# Patient Record
Sex: Female | Born: 1970 | Hispanic: No | Marital: Married | Smoking: Never smoker
Health system: Southern US, Community
[De-identification: ages and names within clinical notes are randomized; demographics above are authoritative.]

## PROBLEM LIST (undated history)

## (undated) DIAGNOSIS — I10 Essential (primary) hypertension: Secondary | ICD-10-CM

## (undated) HISTORY — PX: ABDOMINAL HYSTERECTOMY: SHX81

---

## 2001-07-18 ENCOUNTER — Other Ambulatory Visit: Admission: RE | Admit: 2001-07-18 | Discharge: 2001-07-18 | Payer: Self-pay | Admitting: Obstetrics and Gynecology

## 2015-06-06 ENCOUNTER — Encounter: Payer: Self-pay | Admitting: Vascular Surgery

## 2015-08-02 ENCOUNTER — Emergency Department (HOSPITAL_COMMUNITY): Payer: No Typology Code available for payment source

## 2015-08-02 ENCOUNTER — Emergency Department (HOSPITAL_COMMUNITY)
Admission: EM | Admit: 2015-08-02 | Discharge: 2015-08-02 | Disposition: A | Discharge status: Home / Self Care | Payer: No Typology Code available for payment source | Attending: Emergency Medicine | Admitting: Emergency Medicine

## 2015-08-02 ENCOUNTER — Encounter (HOSPITAL_COMMUNITY): Payer: Self-pay | Admitting: *Deleted

## 2015-08-02 DIAGNOSIS — S4991XA Unspecified injury of right shoulder and upper arm, initial encounter: Secondary | ICD-10-CM | POA: Insufficient documentation

## 2015-08-02 DIAGNOSIS — S199XXA Unspecified injury of neck, initial encounter: Secondary | ICD-10-CM | POA: Diagnosis not present

## 2015-08-02 DIAGNOSIS — Y92410 Unspecified street and highway as the place of occurrence of the external cause: Secondary | ICD-10-CM | POA: Diagnosis not present

## 2015-08-02 DIAGNOSIS — S0990XA Unspecified injury of head, initial encounter: Secondary | ICD-10-CM | POA: Insufficient documentation

## 2015-08-02 DIAGNOSIS — M542 Cervicalgia: Secondary | ICD-10-CM

## 2015-08-02 DIAGNOSIS — Y998 Other external cause status: Secondary | ICD-10-CM | POA: Insufficient documentation

## 2015-08-02 DIAGNOSIS — S4992XA Unspecified injury of left shoulder and upper arm, initial encounter: Secondary | ICD-10-CM | POA: Insufficient documentation

## 2015-08-02 DIAGNOSIS — S3992XA Unspecified injury of lower back, initial encounter: Secondary | ICD-10-CM | POA: Diagnosis not present

## 2015-08-02 DIAGNOSIS — R51 Headache: Secondary | ICD-10-CM

## 2015-08-02 DIAGNOSIS — Y9389 Activity, other specified: Secondary | ICD-10-CM | POA: Diagnosis not present

## 2015-08-02 DIAGNOSIS — R519 Headache, unspecified: Secondary | ICD-10-CM

## 2015-08-02 MED ORDER — IBUPROFEN 800 MG PO TABS: 800.0000 mg | ORAL_TABLET | Freq: Three times a day (TID) | ORAL | Status: DC

## 2015-08-02 MED ORDER — CYCLOBENZAPRINE HCL 10 MG PO TABS: 10.0000 mg | ORAL_TABLET | Freq: Every day | ORAL | Status: DC

## 2015-08-02 MED ORDER — IBUPROFEN 800 MG PO TABS
800.0000 mg | ORAL_TABLET | Freq: Once | ORAL | Status: AC
  Administered 2015-08-02: 800 mg via ORAL
  Filled 2015-08-02: qty 1

## 2015-08-02 MED ORDER — ACETAMINOPHEN 325 MG PO TABS
650.0000 mg | ORAL_TABLET | Freq: Once | ORAL | Status: AC
  Administered 2015-08-02: 650 mg via ORAL
  Filled 2015-08-02: qty 2
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
## 2015-08-02 NOTE — ED Notes (Signed)
Patient transported to X-ray 
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
## 2015-08-02 NOTE — ED Notes (Signed)
C collar removed
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
## 2015-08-02 NOTE — Discharge Instructions (Signed)

## 2015-11-22 ENCOUNTER — Encounter (HOSPITAL_COMMUNITY): Payer: Self-pay | Admitting: *Deleted

## 2015-11-22 ENCOUNTER — Emergency Department (HOSPITAL_COMMUNITY)
Admission: EM | Admit: 2015-11-22 | Discharge: 2015-11-23 | Disposition: A | Discharge status: Home / Self Care | Payer: No Typology Code available for payment source | Attending: Emergency Medicine | Admitting: Emergency Medicine

## 2015-11-22 DIAGNOSIS — Y999 Unspecified external cause status: Secondary | ICD-10-CM | POA: Insufficient documentation

## 2015-11-22 DIAGNOSIS — Y939 Activity, unspecified: Secondary | ICD-10-CM | POA: Insufficient documentation

## 2015-11-22 DIAGNOSIS — Y92410 Unspecified street and highway as the place of occurrence of the external cause: Secondary | ICD-10-CM | POA: Insufficient documentation

## 2015-11-22 DIAGNOSIS — S161XXA Strain of muscle, fascia and tendon at neck level, initial encounter: Secondary | ICD-10-CM | POA: Diagnosis not present

## 2015-11-22 DIAGNOSIS — S199XXA Unspecified injury of neck, initial encounter: Secondary | ICD-10-CM | POA: Diagnosis present
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
## 2015-11-23 ENCOUNTER — Emergency Department (HOSPITAL_COMMUNITY): Payer: No Typology Code available for payment source

## 2015-11-23 MED ORDER — ACETAMINOPHEN 325 MG PO TABS
650.0000 mg | ORAL_TABLET | Freq: Once | ORAL | Status: AC
  Administered 2015-11-23: 650 mg via ORAL
  Filled 2015-11-23: qty 2

## 2015-11-23 NOTE — Discharge Instructions (Signed)
You have neck pain, possibly from a cervical strain and/or pinched nerve.  ° °SEEK IMMEDIATE MEDICAL ATTENTION IF: °You develop difficulties swallowing or breathing.  °You have new or worse numbness, weakness, tingling, or movement problems in your arms or legs.  °You develop increasing pain which is uncontrolled with medications.  °You have change in bowel or bladder function, or other concerns. ° ° ° °
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
## 2018-09-20 ENCOUNTER — Encounter (HOSPITAL_COMMUNITY): Payer: Self-pay

## 2018-09-20 ENCOUNTER — Emergency Department (HOSPITAL_COMMUNITY): Payer: BLUE CROSS/BLUE SHIELD

## 2018-09-20 ENCOUNTER — Emergency Department (HOSPITAL_COMMUNITY)
Admission: EM | Admit: 2018-09-20 | Discharge: 2018-09-20 | Disposition: A | Discharge status: Home / Self Care | Payer: BLUE CROSS/BLUE SHIELD | Attending: Emergency Medicine | Admitting: Emergency Medicine

## 2018-09-20 DIAGNOSIS — R0981 Nasal congestion: Secondary | ICD-10-CM | POA: Insufficient documentation

## 2018-09-20 DIAGNOSIS — Z79899 Other long term (current) drug therapy: Secondary | ICD-10-CM | POA: Insufficient documentation

## 2018-09-20 DIAGNOSIS — I10 Essential (primary) hypertension: Secondary | ICD-10-CM | POA: Diagnosis not present

## 2018-09-20 DIAGNOSIS — R519 Headache, unspecified: Secondary | ICD-10-CM

## 2018-09-20 DIAGNOSIS — R51 Headache: Secondary | ICD-10-CM | POA: Diagnosis not present

## 2018-09-20 MED ORDER — FLUTICASONE PROPIONATE 50 MCG/ACT NA SUSP: 1.0000 | Freq: Every day | NASAL | 2 refills | Status: DC

## 2018-09-20 MED ORDER — ACETAMINOPHEN 500 MG PO TABS
1000.0000 mg | ORAL_TABLET | Freq: Once | ORAL | Status: AC
  Administered 2018-09-20: 1000 mg via ORAL
  Filled 2018-09-20: qty 2

## 2018-09-20 NOTE — ED Notes (Signed)
Patient verbalizes understanding of discharge instructions. Opportunity for questioning and answers were provided. Armband removed by staff, pt discharged from ED. Pt ambulatory to lobby.  

## 2018-09-20 NOTE — ED Notes (Signed)
Patient transported to CT 

## 2018-09-20 NOTE — Discharge Instructions (Addendum)
You can take Tylenol or Ibuprofen as directed for pain. You can alternate Tylenol and Ibuprofen every 4 hours. If you take Tylenol at 1pm, then you can take Ibuprofen at 5pm. Then you can take Tylenol again at 9pm.   Use Flonase as directed for nasal congestion.  As we discussed, your blood pressure here today was elevated.  You need to follow-up with a primary care doctor regarding your blood pressure to make sure that you do not need any medication.  As we discussed today, you do not meet any criteria for COVID-19 thing.  We are recommending anybody with respiratory symptoms to self quarantine themselves for 2 weeks.  Return emergency department for any vision changes, fever, vomiting, difficulty walking, numbness/weakness of your arms or legs, chest pain, difficulty breathing or any other worsening concerning symptoms.

## 2018-09-20 NOTE — ED Provider Notes (Signed)
[facility] EMERGENCY DEPARTMENT Provider Note   CSN: [id number] Arrival date & time: 09/20/18  1907    History   Chief Complaint Chief Complaint  Patient presents with   Headache   Sneezing    HPI Tracey Alvarado is a 48 y.o. female presents for evaluation of 1 week of intermittent headache, sneezing, nasal congestion, rhinorrhea, chills.  Patient reports that for the last week, she has had intermittent headache.  She states the headache will come on gradual in nature and will get progressively worse. She denies any thunderclap headache. She states that she will take Advil and then the headache will get better. She denies any preceding trauma, injury or fall. Patient reports that the headache will get better for a little while and then comes backs. She states headache is currently mild.  She states she has also had some nasal congestion and sneezing. She reports feeling subjective fever and chills but states she has not measured a temperature. She reports an occasional dry cough. No hemoptysis. Patient reports she will intermittently have some lightheadedness. She denies any dizziness, difficulty walking, numbness/weakness, vision changes. She states this morning she had some rhinorrhea, watery discharge from her eyes and sneezing. She took Benadryl which she states improved the symptoms. Patient reports that she saw on the news that this might be concerning for COVID-19 so she came to the ER to get it checked. Patient denies any recent travel. Patient states she has not had any known sick contacts. She denies any known COVID-19 exposure. Patient denies any CP, SOB, abdominal pain, nausea/vomiting. Patient denies any history of headaches.       The history is provided by the patient. The history is limited by a language barrier. No language interpreter was used (Offerred patient language interpreter but she declined. ).    No past medical history on file.  There  are no active problems to display for this patient.   Past Surgical History:  Procedure Laterality Date   ABDOMINAL HYSTERECTOMY     CESAREAN SECTION       OB History   No obstetric history on file.      Home Medications    Prior to Admission medications   Medication Sig Start Date End Date Taking? Authorizing Provider  acetaminophen (TYLENOL) 500 MG tablet Take 1,000 mg by mouth every 6 (six) hours as needed for mild pain or headache.   Yes [provider]  diphenhydrAMINE (BENADRYL) 25 MG tablet Take 25 mg by mouth every 6 (six) hours as needed for itching or allergies.   Yes [provider]  ibuprofen (ADVIL,MOTRIN) 200 MG tablet Take 400 mg by mouth every 6 (six) hours as needed for headache or mild pain.   Yes [provider]  cyclobenzaprine (FLEXERIL) 10 MG tablet Take 1 tablet (10 mg total) by mouth at bedtime. Patient not taking: Reported on 09/20/2018 08/02/15   Cheri Fowler, PA-C  fluticasone [facility]) 50 MCG/ACT nasal spray Place 1 spray into both nostrils daily. 09/20/18   Maxwell Caul, PA-C  fluticasone (FLONASE) 50 MCG/ACT nasal spray Place 1 spray into both nostrils daily. 09/20/18   Maxwell Caul, PA-C  ibuprofen (ADVIL,MOTRIN) 800 MG tablet Take 1 tablet (800 mg total) by mouth 3 (three) times daily. Patient not taking: Reported on 09/20/2018 08/02/15   Cheri Fowler, PA-C    Family History No family history on file.  Social History Social History   Tobacco Use   Smoking status: Never Smoker  Smokeless tobacco: Never Used  Substance Use Topics   Alcohol use: No   Drug use: No     Allergies   Patient has no known allergies.   Review of Systems Review of Systems  Constitutional: Positive for fever (Subjective).  HENT: Positive for congestion.   Eyes: Negative for visual disturbance.  Respiratory: Positive for cough. Negative for shortness of breath.   Cardiovascular: Negative for chest pain.  Gastrointestinal:  Negative for abdominal pain, nausea and vomiting.  Genitourinary: Negative for dysuria and hematuria.  Musculoskeletal: Negative for gait problem.  Neurological: Positive for light-headedness and headaches. Negative for dizziness, weakness and numbness.  All other systems reviewed and are negative.    Physical Exam Updated Vital Signs BP (!) 160/100    Pulse 76    Temp 98.4 F (36.9 C) (Oral)    Resp 16    SpO2 98%   Physical Exam Vitals signs and nursing note reviewed.  Constitutional:      Appearance: Normal appearance. She is well-developed.  HENT:     Head: Normocephalic and atraumatic.     Nose: Congestion present.     Right Sinus: No maxillary sinus tenderness or frontal sinus tenderness.     Left Sinus: No maxillary sinus tenderness or frontal sinus tenderness.  Eyes:     General: Lids are normal.     Conjunctiva/sclera: Conjunctivae normal.     Pupils: Pupils are equal, round, and reactive to light.     Comments: PERRL, EOMs intact.   Neck:     Musculoskeletal: Full passive range of motion without pain and neck supple.     Comments: Neck is supple and without rigidity.  Cardiovascular:     Rate and Rhythm: Normal rate and regular rhythm.     Pulses: Normal pulses.     Heart sounds: Normal heart sounds. No murmur. No friction rub. No gallop.   Pulmonary:     Effort: Pulmonary effort is normal.     Breath sounds: Normal breath sounds.     Comments: Lungs clear to auscultation bilaterally.  Symmetric chest rise.  No wheezing, rales, rhonchi. Abdominal:     Palpations: Abdomen is soft. Abdomen is not rigid.     Tenderness: There is no abdominal tenderness. There is no guarding.  Musculoskeletal: Normal range of motion.  Skin:    General: Skin is warm and dry.     Capillary Refill: Capillary refill takes less than 2 seconds.  Neurological:     Mental Status: She is alert and oriented to person, place, and time.     Comments: Cranial nerves III-XII intact Follows  commands, Moves all extremities  5/5 strength to BUE and BLE  Sensation intact throughout all major nerve distributions Normal coordination No pronator drift. No gait abnormalities  No slurred speech. No facial droop.   Psychiatric:        Speech: Speech normal.      ED Treatments / Results  Labs (all labs ordered are listed, but only abnormal results are displayed) Labs Reviewed - No data to display  EKG None  Radiology Ct Head Wo Contrast  Result Date: 09/20/2018 CLINICAL DATA:  Headache. EXAM: CT HEAD WITHOUT CONTRAST TECHNIQUE: Contiguous axial images were obtained from the base of the skull through the vertex without intravenous contrast. COMPARISON:  None. FINDINGS: Brain: No evidence of acute infarction, hemorrhage, hydrocephalus, extra-axial collection or mass lesion/mass effect. Vascular: No hyperdense vessel or unexpected calcification. Skull: Normal. Negative for fracture or focal lesion. Sinuses/Orbits:  No acute finding. Other: None. IMPRESSION: Normal head CT. Electronically Signed   By: Lupita Raider, M.D.   On: 09/20/2018 20:20    Procedures Procedures (including critical care time)  Medications Ordered in ED Medications  acetaminophen (TYLENOL) tablet 1,000 mg (1,000 mg Oral Given 09/20/18 2018)     Initial Impression / Assessment and Plan / ED Course  I have reviewed the triage vital signs and the nursing notes.  Pertinent labs & imaging results that were available during my care of the patient were reviewed by me and considered in my medical decision making (see chart for details).        48 y.o. F who presents for evaluation of 1 week of intermittent headache, congestion, subjective fevers/chills, rhinorrhea. Headache is gradual in nature. No vision changes, nausea/vomiting, difficulty walking. No trauma or injury. Also reports some sneezing, cough. No CP, SOB. She reports she saw on the news that this might be concerning for COVID-19 so she came to the  ER. Patient is afebrile, non-toxic appearing, sitting comfortably on examination table.  Vital signs reviewed. She is hypertensive. No CP, SOB, numbness/weakness. She states she has not had a history of HTN previously. No neuro deficits noted on exam. Patient states she is worried her symptoms might be due to COVID-19. Patient with no recent travel, no sick contacts. Lungs clear to auscultation. No evidence of respiratory distress. Consider seasonal allergies vs viral URI vs sinus congestion. History/physical exam is not concerning for meningitis, CVA/posterior stroke. Do not suspect ICH or hypertensive emergency. Patient states she does not have a history of headaches. Will plan for CT head and PO analgesics.   Head CT negative for any acute intracranial abnormality.   Repeat vitals show improved blood pressure. She is still slightly hypertensive. Discussed results with patient. She reports improvement in headache after analgesics. Patient declined any further medication here in the ED. Patient states she would like to go home. Patient able to ambulate here in the ED without any difficulty. NO gait ataxia. I discussed with patient that at this time she does not meet criteria for COVID-19 testing. Instructed patient to self quarantine herself give URI symptoms. Instructed pateint to follow up with [facility] for blood pressure. At this time, patient exhibits no emergent life-threatening condition that require further evaluation in ED. Patient had ample opportunity for questions and discussion. All patient's questions were answered with full understanding. Strict return precautions discussed. Patient expresses understanding and agreement to plan.   Davie Tith was evaluated in Emergency Department on 09/21/2018 for the symptoms described in the history of present illness. She was evaluated in the context of the global COVID-19 pandemic, which necessitated consideration that the patient might  be at risk for infection with the SARS-CoV-2 virus that causes COVID-19. Institutional protocols and algorithms that pertain to the evaluation of patients at risk for COVID-19 are in a state of rapid change based on information released by regulatory bodies including the CDC and federal and state organizations. These policies and algorithms were followed during the patient's care in the ED.   Portions of this note were generated with Scientist, clinical (histocompatibility and immunogenetics). Dictation errors may occur despite best attempts at proofreading.   Final Clinical Impressions(s) / ED Diagnoses   Final diagnoses:  Nasal congestion  Acute nonintractable headache, unspecified headache type  Hypertension, unspecified type    ED Discharge Orders         Ordered    fluticasone (FLONASE) 50 MCG/ACT nasal  spray  Daily     09/20/18 2129    fluticasone (FLONASE) 50 MCG/ACT nasal spray  Daily     09/20/18 2143           Rosana Hoes 09/21/18 6010    Little, Ambrose Finland, MD 09/21/18 1336

## 2018-09-20 NOTE — ED Triage Notes (Addendum)
Pt arrives POV for sneezing and intermittent headache x 1 week. Pt denies chest pain or sob. Pt alert, oriented, and ambulatory. Pt endorses took benadryl this am which helped.

## 2020-01-12 ENCOUNTER — Encounter (HOSPITAL_COMMUNITY): Payer: Self-pay | Admitting: Emergency Medicine

## 2020-01-12 ENCOUNTER — Emergency Department (HOSPITAL_COMMUNITY)
Admission: EM | Admit: 2020-01-12 | Discharge: 2020-01-13 | Disposition: A | Discharge status: Home / Self Care | Payer: BLUE CROSS/BLUE SHIELD | Attending: Emergency Medicine | Admitting: Emergency Medicine

## 2020-01-12 ENCOUNTER — Other Ambulatory Visit: Payer: Self-pay

## 2020-01-12 DIAGNOSIS — T7840XA Allergy, unspecified, initial encounter: Secondary | ICD-10-CM

## 2020-01-12 DIAGNOSIS — R000 Tachycardia, unspecified: Secondary | ICD-10-CM | POA: Insufficient documentation

## 2020-01-12 DIAGNOSIS — Z20822 Contact with and (suspected) exposure to covid-19: Secondary | ICD-10-CM | POA: Insufficient documentation

## 2020-01-12 DIAGNOSIS — R21 Rash and other nonspecific skin eruption: Secondary | ICD-10-CM | POA: Diagnosis present

## 2020-01-12 DIAGNOSIS — L500 Allergic urticaria: Secondary | ICD-10-CM | POA: Insufficient documentation

## 2020-01-12 LAB — BASIC METABOLIC PANEL
Anion gap: 7 (ref 5–15)
BUN: 21 mg/dL — ABNORMAL HIGH (ref 6–20)
CO2: 25 mmol/L (ref 22–32)
Calcium: 9.6 mg/dL (ref 8.9–10.3)
Chloride: 108 mmol/L (ref 98–111)
Creatinine, Ser: 0.81 mg/dL (ref 0.44–1.00)
GFR calc Af Amer: >60 mL/min (ref >60)
GFR calc non Af Amer: >60 mL/min (ref >60)
Glucose, Bld: 130 mg/dL — ABNORMAL HIGH (ref 70–99)
Potassium: 3.9 mmol/L (ref 3.5–5.1)
Sodium: 140 mmol/L (ref 135–145)

## 2020-01-12 LAB — CBC
HCT: 43.5 % (ref 36.0–46.0)
Hemoglobin: 14.5 g/dL (ref 12.0–15.0)
MCH: 31 pg (ref 26.0–34.0)
MCHC: 33.3 g/dL (ref 30.0–36.0)
MCV: 93.1 fL (ref 80.0–100.0)
Platelets: 207 10*3/uL (ref 150–400)
RBC: 4.67 MIL/uL (ref 3.87–5.11)
RDW: 12.8 % (ref 11.5–15.5)
WBC: 11.4 10*3/uL — ABNORMAL HIGH (ref 4.0–10.5)
nRBC: 0 % (ref 0.0–0.2)

## 2020-01-12 MED ORDER — ACETAMINOPHEN 325 MG PO TABS
650.0000 mg | ORAL_TABLET | Freq: Once | ORAL | Status: AC | PRN
  Administered 2020-01-12: 650 mg via ORAL
  Filled 2020-01-12: qty 2

## 2020-01-12 NOTE — ED Triage Notes (Signed)
Pt presents to ED POV. Pt c/o allergic reaction and n/d. Seen at [facility] given papcid and prednisone. Since then pt has had n/d, febrile today.

## 2020-01-13 ENCOUNTER — Emergency Department (HOSPITAL_COMMUNITY): Payer: BLUE CROSS/BLUE SHIELD

## 2020-01-13 LAB — HEPATIC FUNCTION PANEL
ALT: 29 U/L (ref 0–44)
AST: 23 U/L (ref 15–41)
Albumin: 4.1 g/dL (ref 3.5–5.0)
Alkaline Phosphatase: 58 U/L (ref 38–126)
Bilirubin, Direct: 0.1 mg/dL (ref 0.0–0.2)
Indirect Bilirubin: 0.8 mg/dL (ref 0.3–0.9)
Total Bilirubin: 0.9 mg/dL (ref 0.3–1.2)
Total Protein: 6.9 g/dL (ref 6.5–8.1)

## 2020-01-13 LAB — URINALYSIS, ROUTINE W REFLEX MICROSCOPIC
Bilirubin Urine: NEGATIVE
Glucose, UA: NEGATIVE mg/dL
Hgb urine dipstick: NEGATIVE
Ketones, ur: NEGATIVE mg/dL
Nitrite: NEGATIVE
Protein, ur: 30 mg/dL — AB
Specific Gravity, Urine: 1.033 — ABNORMAL HIGH (ref 1.005–1.030)
pH: 5 (ref 5.0–8.0)

## 2020-01-13 LAB — SARS CORONAVIRUS 2 BY RT PCR (HOSPITAL ORDER, PERFORMED IN ~~LOC~~ HOSPITAL LAB): SARS Coronavirus 2: NEGATIVE

## 2020-01-13 MED ORDER — EPINEPHRINE 0.3 MG/0.3ML IJ SOSY
PREFILLED_SYRINGE | INTRAMUSCULAR | 0 refills | Status: AC
Start: 2020-01-13 — End: ?

## 2020-01-13 MED ORDER — CEPHALEXIN 500 MG PO CAPS: 500.0000 mg | ORAL_CAPSULE | Freq: Two times a day (BID) | ORAL | 0 refills | Status: AC

## 2020-01-13 MED ORDER — DIPHENHYDRAMINE HCL 50 MG/ML IJ SOLN
50.0000 mg | Freq: Once | INTRAMUSCULAR | Status: AC
  Administered 2020-01-13: 50 mg via INTRAVENOUS
  Filled 2020-01-13: qty 1

## 2020-01-13 MED ORDER — CEPHALEXIN 250 MG PO CAPS
500.0000 mg | ORAL_CAPSULE | Freq: Once | ORAL | Status: AC
  Administered 2020-01-13: 500 mg via ORAL
  Filled 2020-01-13: qty 2

## 2020-01-13 NOTE — ED Provider Notes (Signed)
[facility] EMERGENCY DEPARTMENT Provider Note   CSN: [id number] Arrival date & time: 01/12/20  2024     History Chief Complaint  Patient presents with  . Allergic Reaction    Tracey Alvarado is a 49 y.o. female.  who presents to the ED with diffuse red rash that itches for the past two days. 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 She states she was weed eating and using a leaf blower, denies picking weeds with her bare hands. Denies walking in wooded area. She is unsure if she was around any poison ivy/oak. Denies any tick bites. She states she is often bit by mosquitos while at work, but that she wears long pants and a long sleeve shirt as well.  When she got home after working two days ago, she states the itching was worse and she noticed a red raised rash. She states she took benadryl and the itching or rash did not improve, she took another dose of benadryl and the rash/itching improved, but then returned and was worse. She then noticed the rash was on her legs, torso, back, and face. She went to urgent care yesterday and was prescribed famotidine and prednisone. She denies taking any doses of prednisone. This morning she felt as though something was in her throat when she swallowed, she mentions that she felt as though she felt short breath. She also notes a change in her voice. Her lips and tongue were swollen but she states this has improved.   She notes associated symptoms of 2 episodes of non-bloody diarrhea yesterday and after her bowel movements she felt fatigued, warm, and had sweats. She denies chest pain, back pain, abdominal pain, nausea, vomiting, or leg pain. She denies any urinary symptoms of pain when she urinates or difficulty urinating. She denies recent illness. She denies any rashes prior to this one.   She denies change in diet or soap. She denies starting any new medications recently.  She denies any other recent lifestyle changes. When asked about her allergy history, she states that grass causes her to itch, but it resolves on its own and has never been systemic. She denies anything like this in the past.   Patient notes she is COVID vaccinated.   The history is provided by the patient.  Rash Location:  Full body Quality: itchiness, redness and swelling   Severity:  Moderate Onset quality:  Gradual Timing:  Constant Progression:  Spreading Context: not new detergent/soap and not sick contacts   Relieved by:  Antihistamines Associated symptoms: diarrhea, fatigue, fever, hoarse voice, shortness of breath and tongue swelling   Associated symptoms: no abdominal pain, no myalgias, no nausea, not vomiting and not wheezing   Diarrhea:    Severity:  Mild     History reviewed. No pertinent past medical history.  There are no problems to display for this patient.   Past Surgical History:  Procedure Laterality Date  . ABDOMINAL HYSTERECTOMY    . CESAREAN SECTION       OB History   No obstetric history on file.     History reviewed. No pertinent family history.  Social History   Tobacco Use  . Smoking status: Never Smoker  . Smokeless tobacco: Never Used  Substance Use Topics  . Alcohol use: No  . Drug use: No    Home Medications Prior to Admission medications   Medication Sig Start Date End Date Taking?  Authorizing Provider  acetaminophen (TYLENOL) 500 MG tablet Take 1,000 mg by mouth every 6 (six) hours as needed for mild pain, fever or headache.    Yes [provider]  diphenhydrAMINE (BENADRYL) 25 MG tablet Take 25 mg by mouth every 6 (six) hours as needed for itching or allergies.   Yes [provider]  famotidine (PEPCID) 20 MG tablet Take 20 mg by mouth 2 (two) times daily.   Yes [provider]  ibuprofen (ADVIL,MOTRIN) 200 MG tablet Take 400 mg by mouth every 6 (six) hours as needed for fever, headache or mild pain.     Yes [provider]  cephALEXin (KEFLEX) 500 MG capsule Take 1 capsule (500 mg total) by mouth 2 (two) times daily for 7 days. 01/14/20 01/21/20  Belva Agee, MD  cyclobenzaprine (FLEXERIL) 10 MG tablet Take 1 tablet (10 mg total) by mouth at bedtime. Patient not taking: Reported on 09/20/2018 08/02/15   Cheri Fowler, PA-C  EPINEPHrine 0.3 MG/0.3ML SOSY Inject one auto-injector into thigh one time if symptoms of anaphylaxis and seek medical attention. 01/13/20   Samayra Hebel, MD  fluticasone (FLONASE) 50 MCG/ACT nasal spray Place 1 spray into both nostrils daily. Patient not taking: Reported on 01/13/2020 09/20/18   Graciella Freer A, PA-C  fluticasone (FLONASE) 50 MCG/ACT nasal spray Place 1 spray into both nostrils daily. Patient not taking: Reported on 01/13/2020 09/20/18   Maxwell Caul, PA-C  ibuprofen (ADVIL,MOTRIN) 800 MG tablet Take 1 tablet (800 mg total) by mouth 3 (three) times daily. Patient not taking: Reported on 09/20/2018 08/02/15   Cheri Fowler, PA-C  predniSONE (STERAPRED UNI-PAK 48 TAB) 10 MG (48) TBPK tablet Take 1-2 tablets by mouth See admin instructions. Take 2 tablets before breakfast, take 1 tablet after lunch, take 1 tablet after supper, take 2 tablets at bedtime for the first 4 days. On days 5-8, take 1 tablet before breakfast, 1 tablet after lunch. 1 tablet after supper, and 1 tablet at bedtime. On days 9-12 take 1 tablet before breakfast and at bedtime. 01/12/20   [provider]    Allergies    Patient has no known allergies.  Review of Systems   Review of Systems  Constitutional: Positive for fatigue and fever.  HENT: Positive for hoarse voice.        Felt like something was in her throat when swallowing and change in voice. Lip and tongue swelling  Respiratory: Positive for shortness of breath. Negative for wheezing.   Cardiovascular: Negative for chest pain and leg swelling.  Gastrointestinal: Positive for diarrhea. Negative for  abdominal pain, constipation, nausea and vomiting.  Genitourinary: Negative for difficulty urinating and dysuria.  Musculoskeletal: Negative for myalgias.  Skin: Positive for rash (diffuse itchy rash).  All other systems reviewed and are negative.   Physical Exam Updated Vital Signs BP 127/67   Pulse 84   Temp (!) 100.6 F (38.1 C) (Oral)   Resp 17   Ht 5\' 1"  (1.549 m)   Wt 63.5 kg   SpO2 97%   BMI 26.45 kg/m   Physical Exam Vitals and nursing note reviewed.  Constitutional:      Comments: Diffuse rash present  HENT:     Mouth/Throat:     Mouth: Mucous membranes are moist.     Pharynx: Oropharynx is clear.     Comments: Lips appear slightly swollen. No pharyngeal or tongue swelling present  Eyes:     Pupils: Pupils are equal, round, and reactive to light.  Cardiovascular:     Rate and Rhythm: Regular rhythm. Tachycardia present.     Heart sounds: Normal heart sounds.  Pulmonary:     Effort: Pulmonary effort is normal. No respiratory distress.     Breath sounds: Normal breath sounds. No stridor. No wheezing, rhonchi or rales.  Abdominal:     General: Abdomen is flat. There is no distension.     Palpations: Abdomen is soft.     Tenderness: There is no abdominal tenderness. There is no guarding or rebound.  Musculoskeletal:        General: No swelling.     Cervical back: Normal range of motion and neck supple. No tenderness.     Right lower leg: No edema.     Left lower leg: No edema.  Skin:    Findings: Rash present. Rash is urticarial (Rash present on face, neck, extremities, torso). Rash is not papular, pustular or vesicular.  Neurological:     General: No focal deficit present.     Mental Status: She is alert and oriented to person, place, and time.  Psychiatric:        Mood and Affect: Mood normal.        Behavior: Behavior normal.     ED Results / Procedures / Treatments   Labs (all labs ordered are listed, but only abnormal results are displayed) Labs  Reviewed  CBC - Abnormal; Notable for the following components:      Result Value   WBC 11.4 (*)    All other components within normal limits  BASIC METABOLIC PANEL - Abnormal; Notable for the following components:   Glucose, Bld 130 (*)    BUN 21 (*)    All other components within normal limits  URINALYSIS, ROUTINE W REFLEX MICROSCOPIC - Abnormal; Notable for the following components:   APPearance CLOUDY (*)    Specific Gravity, Urine 1.033 (*)    Protein, ur 30 (*)    Leukocytes,Ua LARGE (*)    Bacteria, UA RARE (*)    All other components within normal limits  SARS CORONAVIRUS 2 BY RT PCR (HOSPITAL ORDER, PERFORMED IN [place] [facility])  URINE CULTURE  HEPATIC FUNCTION PANEL    EKG None  Radiology DG Chest 1 View  Result Date: 01/13/2020 CLINICAL DATA:  Allergic reaction.  Now with nausea and diarrhea. EXAM: CHEST  1 VIEW COMPARISON:  None. FINDINGS: The heart size and mediastinal contours are within normal limits. Both lungs are clear. The visualized skeletal structures are unremarkable. IMPRESSION: No active disease. Electronically Signed   By: Signa Kellaylor  Stroud M.D.   On: 01/13/2020 10:06    Procedures Procedures (including critical care time)  Medications Ordered in ED Medications  cephALEXin (KEFLEX) capsule 500 mg (has no administration in time range)  acetaminophen (TYLENOL) tablet 650 mg (650 mg Oral Given 01/12/20 2120)  diphenhydrAMINE (BENADRYL) injection 50 mg (50 mg Intravenous Given 01/13/20 1050)    ED Course  I have reviewed the triage vital signs and the nursing notes.  Pertinent labs & imaging results that were available during my care of the patient were reviewed by me and considered in my medical decision making (see chart for details).  3:17 PM Urticaria has resolved on her arms, legs, and face. There are some residual areas of urticaria on her chest, but they appear less severe. Patient notes itching has also resolved. She is resting  comfortably in the bed.     MDM Rules/Calculators/A&P  Gaylyn Berish is a 49 y/o F who presents to the ED with diffuse hives for the past three days that are itchy in nature. 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 As the day progressed she noticed diffuse raised rash that was itchy, that improved with benadryl but returned and worsened. She had not taken any additional benadryl since two days ago. She went to urgent care where she was prescribed famotidine and prednisone, however, with no improvement, she came to the ED. On physical exam patient had diffuse urticaria with some lip swelling.   Laboratory data did not reveal significant leukocytosis, electrolyte or hepatic function panel abnormalities. Her urinalysis was suspicious for urinary tract infection with large amounts of leukocytes, and rare bacteria. Low suspicion for contamination as her squamous epithelia were in normal range. Urine culture pending at this time. Her creatinine was .81 and BUN of 21, do not suspect pyelonephritis. Her COVID test was also negative.   Patient's urticaria improved after IV benadryl and patient states she feels better overall.   Patient's temperature of 100.6 while in the ED as well as tachycardia, would lead me to think possible infectious cause. Suspect patient's UTI as cause of increased temperature while in the ED.   Low suspicion for infection, vasculitis and malignancy as cause of rash. Suspected origin may have occurred while patient was at work, insect bite, poison ivy/oak, or other allergic reaction.  Instructed patient to continue benadryl 25 mg every 6 hours PRN for worsening rash/itching and continue prednisone that was written by urgent care. Also given one does of keflex 500 mg PO while in the ED. Will write prescription for keflex and patient will be notified if cultures result in patient's infection being resistant to keflex.  Discussed with patient risk of not continuing to take antibiotics, including worsening infection, kidney infection, or sepsis. Also wrote patient for epipen.   Patient also instructed to return to the ED if symptoms worsen or new symptoms arise such as difficulty breathing, swallowing, back pain, abdominal pain. Also instructed her to call her primary care office tomorrow to follow up. She agrees with the plan.    Final Clinical Impression(s) / ED Diagnoses Final diagnoses:  Allergic reaction, initial encounter    Rx / DC Orders ED Discharge Orders         Ordered    cephALEXin (KEFLEX) 500 MG capsule  2 times daily     Discontinue  Reprint     01/13/20 1509    EPINEPHrine 0.3 MG/0.3ML SOSY     Discontinue  Reprint     01/13/20 1509           Belva Agee, MD 01/13/20 1517    Little, Ambrose Finland, MD 01/17/20 [phone]

## 2020-01-13 NOTE — ED Notes (Signed)
Pt complaining of more hives and itching, RN aware.

## 2020-01-13 NOTE — ED Notes (Signed)
Urine sent to lab WITH culture. 

## 2020-01-13 NOTE — ED Notes (Signed)
AVS reviewed with pt. PT ambulatory out of dept.   

## 2020-01-13 NOTE — Discharge Instructions (Addendum)
Please call your primary care provider tomorrow to follow up for your visit today.   Please take benadryl 25 mg every 6 hours for continued rash, itching and take medications prescribed by the urgent care, prednisone and famotidine. Please also take the prescribed antibiotic, keflex, for your urinary tract infection.   Please return to the emergency department or call 911 if you have any trouble breathing, worsening of your rash/hives, back pain, chest pain, abdominal pain, or trouble swallowing or for any other reason you feel as though you are having a medical emergency.   Thank you for allowing [place] to assist in your care!

## 2020-01-13 NOTE — ED Notes (Signed)
Pt is aware we need a urine sample. 

## 2020-01-14 LAB — URINE CULTURE

## 2020-03-12 ENCOUNTER — Other Ambulatory Visit: Payer: Self-pay | Admitting: Family Medicine

## 2020-03-12 DIAGNOSIS — Z1231 Encounter for screening mammogram for malignant neoplasm of breast: Secondary | ICD-10-CM

## 2020-03-14 DIAGNOSIS — E7800 Pure hypercholesterolemia, unspecified: Secondary | ICD-10-CM | POA: Insufficient documentation

## 2020-03-14 DIAGNOSIS — R7303 Prediabetes: Secondary | ICD-10-CM | POA: Insufficient documentation

## 2021-06-26 ENCOUNTER — Other Ambulatory Visit: Payer: Self-pay

## 2021-06-26 ENCOUNTER — Emergency Department (HOSPITAL_COMMUNITY)
Admission: EM | Admit: 2021-06-26 | Discharge: 2021-06-27 | Disposition: A | Discharge status: Home / Self Care | Payer: No Typology Code available for payment source | Attending: Student | Admitting: Student

## 2021-06-26 ENCOUNTER — Emergency Department (HOSPITAL_COMMUNITY): Payer: No Typology Code available for payment source

## 2021-06-26 DIAGNOSIS — M79604 Pain in right leg: Secondary | ICD-10-CM | POA: Diagnosis not present

## 2021-06-26 DIAGNOSIS — M25561 Pain in right knee: Secondary | ICD-10-CM | POA: Diagnosis not present

## 2021-06-26 DIAGNOSIS — M79601 Pain in right arm: Secondary | ICD-10-CM | POA: Insufficient documentation

## 2021-06-26 DIAGNOSIS — M79641 Pain in right hand: Secondary | ICD-10-CM | POA: Diagnosis not present

## 2021-06-26 DIAGNOSIS — R42 Dizziness and giddiness: Secondary | ICD-10-CM | POA: Diagnosis not present

## 2021-06-26 DIAGNOSIS — M542 Cervicalgia: Secondary | ICD-10-CM | POA: Diagnosis not present

## 2021-06-26 DIAGNOSIS — Y92410 Unspecified street and highway as the place of occurrence of the external cause: Secondary | ICD-10-CM | POA: Diagnosis not present

## 2021-06-26 DIAGNOSIS — Z5321 Procedure and treatment not carried out due to patient leaving prior to being seen by health care provider: Secondary | ICD-10-CM | POA: Insufficient documentation
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
## 2021-06-26 NOTE — ED Notes (Signed)
Pt states she will call medical records to see her results but is leaving d/t wait time

## 2022-03-16 ENCOUNTER — Other Ambulatory Visit: Payer: Self-pay | Admitting: Family Medicine

## 2022-03-16 DIAGNOSIS — Z1231 Encounter for screening mammogram for malignant neoplasm of breast: Secondary | ICD-10-CM

## 2022-03-22 ENCOUNTER — Ambulatory Visit: Payer: Self-pay

## 2022-03-23 ENCOUNTER — Ambulatory Visit
Admission: RE | Admit: 2022-03-23 | Discharge: 2022-03-23 | Disposition: A | Discharge status: Home / Self Care | Payer: Commercial Managed Care - HMO | Source: Ambulatory Visit | Attending: Family Medicine | Admitting: Family Medicine

## 2022-03-23 DIAGNOSIS — Z1231 Encounter for screening mammogram for malignant neoplasm of breast: Secondary | ICD-10-CM

## 2022-05-31 IMAGING — CT CT HEAD W/O CM
4 series · 16 of 47 positions shown, 18 images · non-contrast
Comparison: 09/20/2018

CLINICAL DATA: Dizziness

EXAM:
CT HEAD WITHOUT CONTRAST
TECHNIQUE: Contiguous axial images were obtained from the base of the skull
through the vertex without intravenous contrast.

[Series 3: head wo · axial · 0.36mm/px · z∈[-124,-4]mm · 7 of 34 slices shown, 9 images]
[im 5/34  brain]
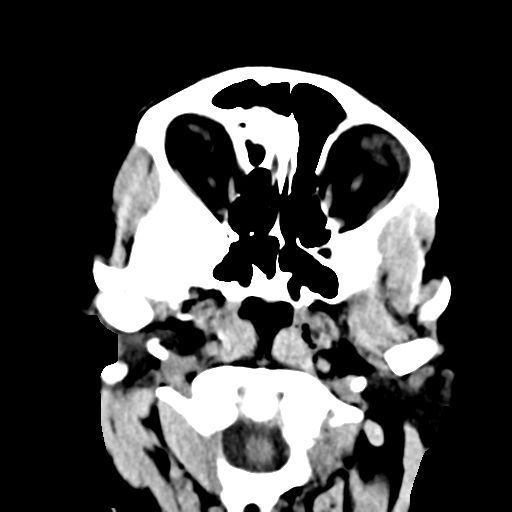
[im 5/34  bone]
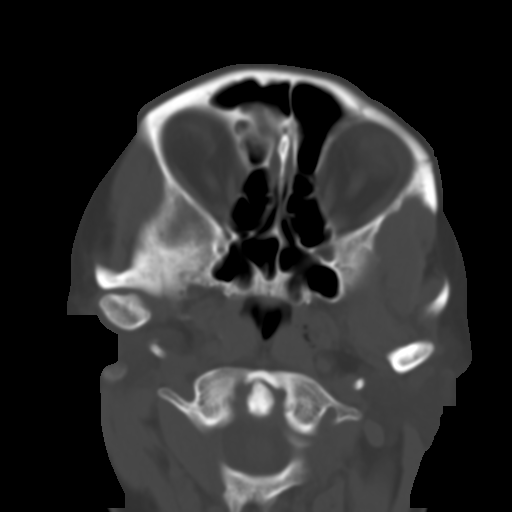
[im 9/34  brain]
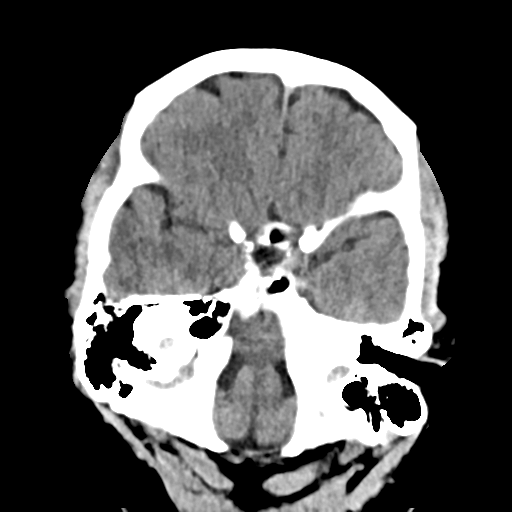
[im 13/34  brain]
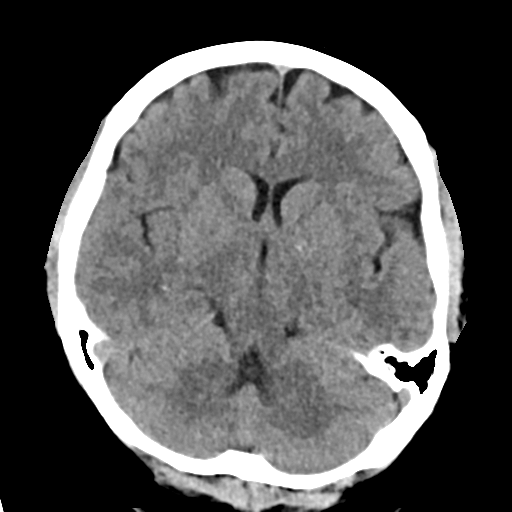
[im 17/34  brain]
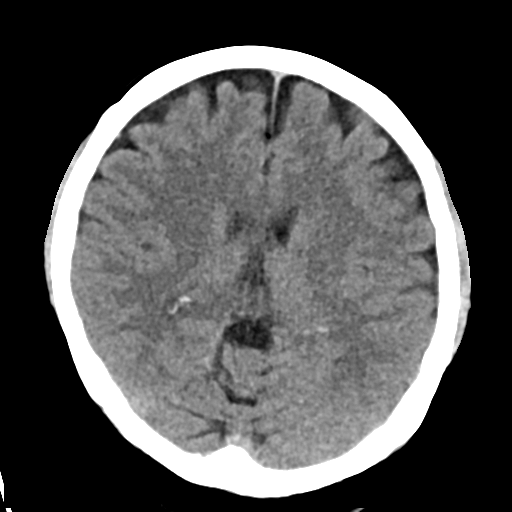
[im 21/34  brain]
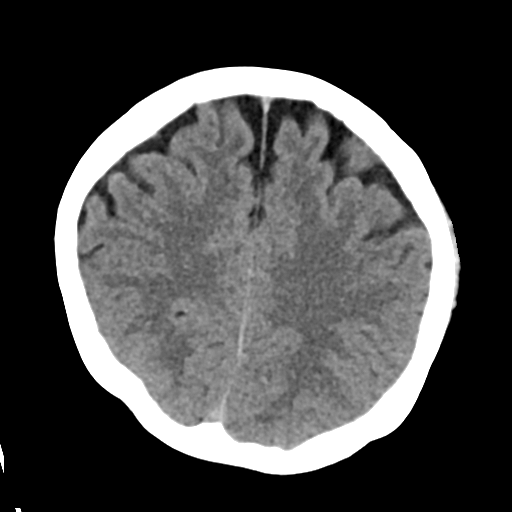
[im 21/34  bone]
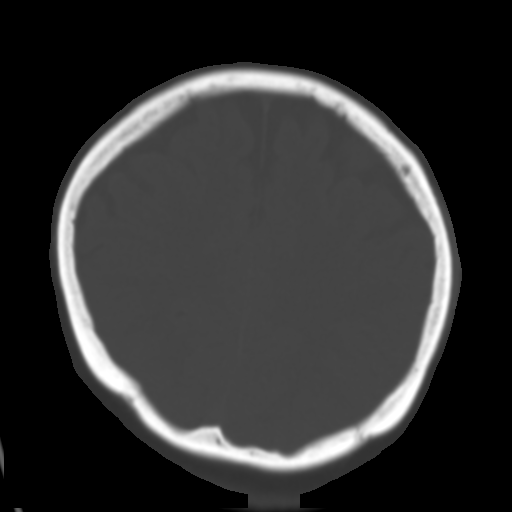
[im 25/34  brain]
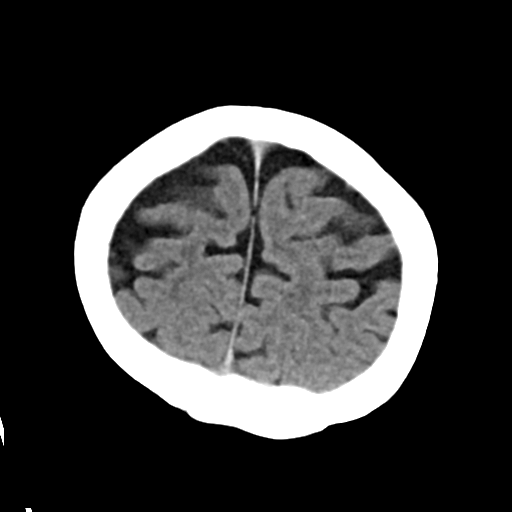
[im 29/34  brain]
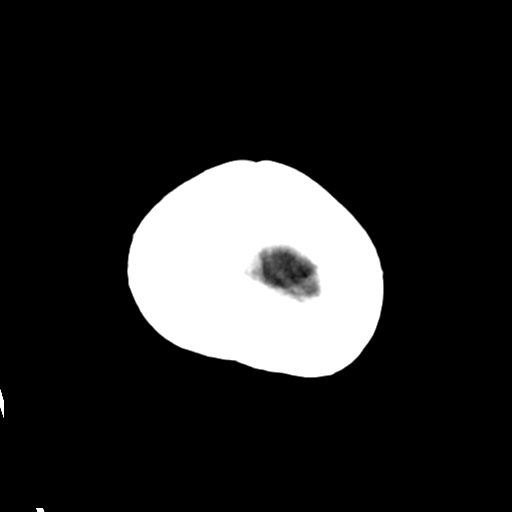

[Series 4: head bone · axial · 0.36mm/px · z∈[-128,-96]mm · 3 of 84 slices shown]
[im 9/84  bone]
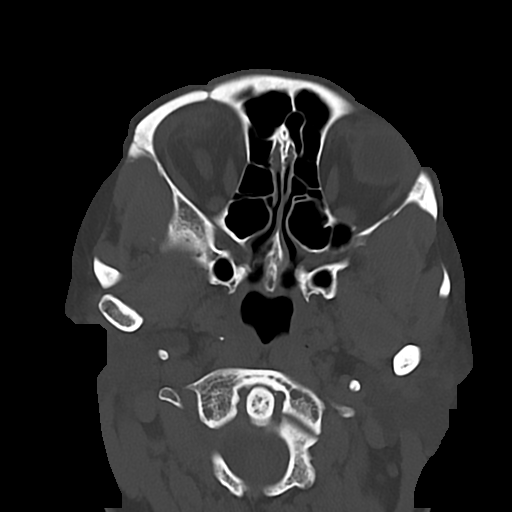
[im 17/84  bone]
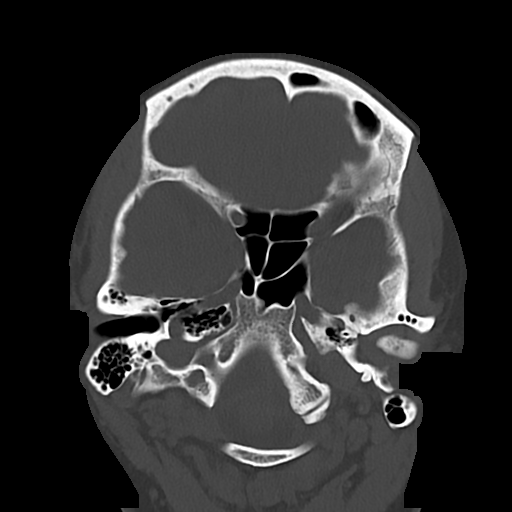
[im 25/84  bone]
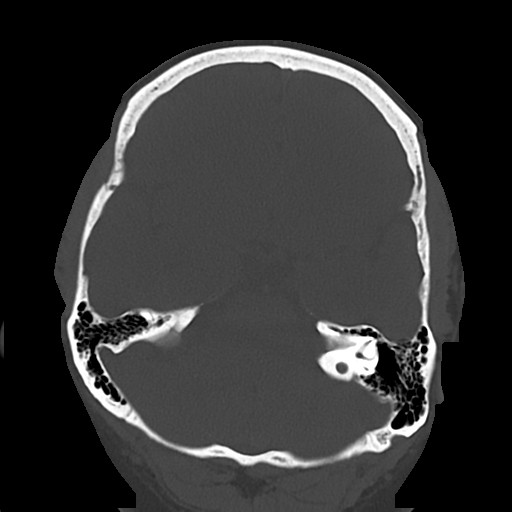

[Series 5: cor soft · coronal · 0.30mm/px · 3 of 60 slices shown]
[im 20/60  brain]
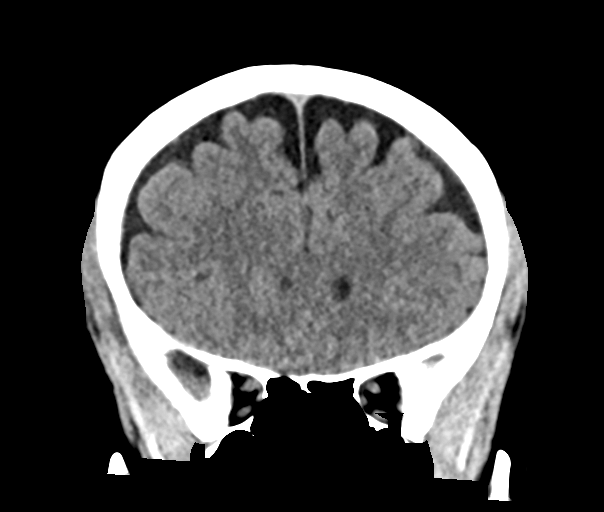
[im 27/60  brain]
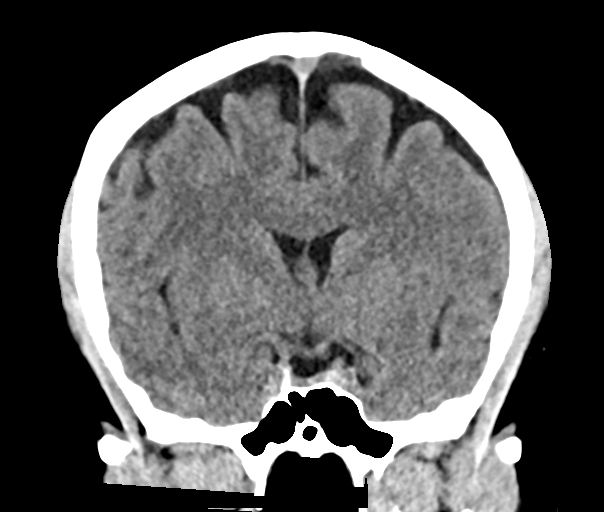
[im 33/60  brain]
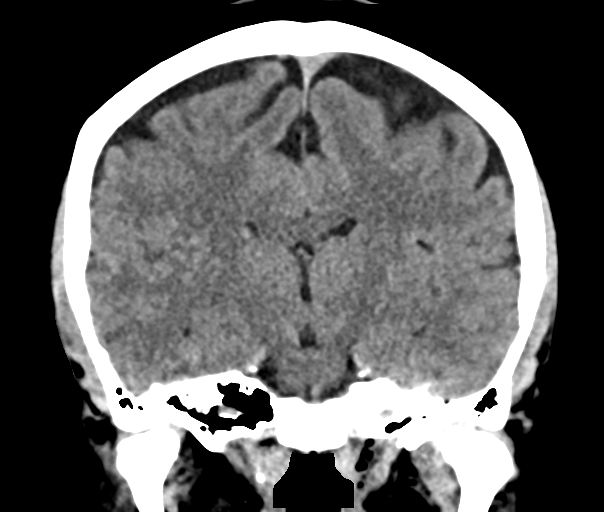

[Series 6: sag soft · sagittal · 0.30mm/px · 3 of 59 slices shown]
[im 20/59  brain]
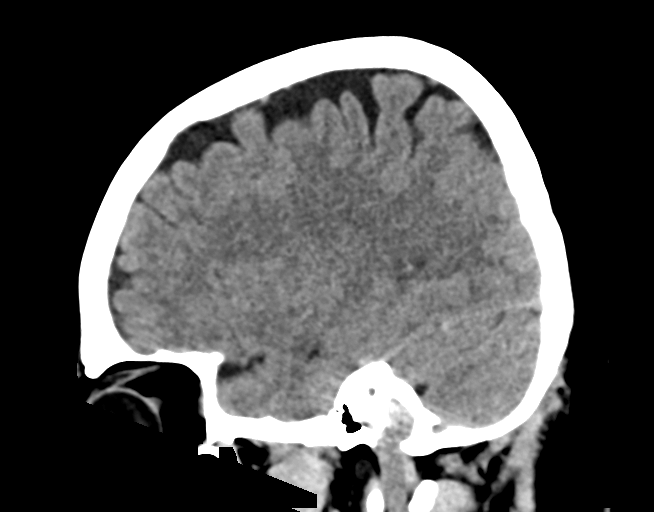
[im 30/59  brain]
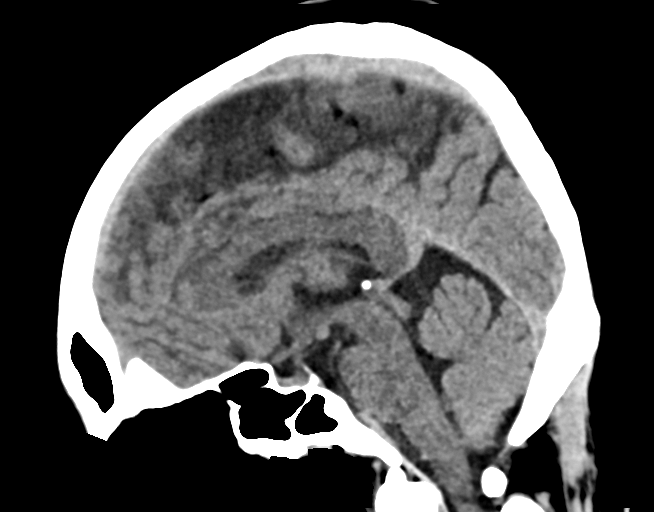
[im 39/59  brain]
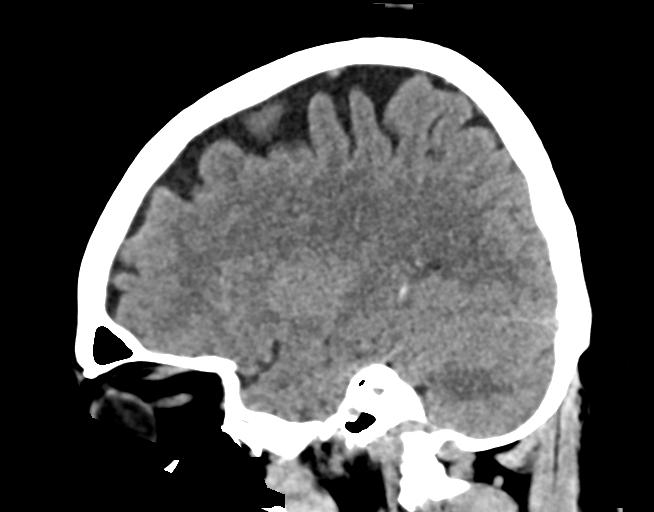

[16 of 47 positions shown; findings below may reference images not displayed]

FINDINGS: Brain: No acute intracranial abnormality. Specifically, no
hemorrhage, hydrocephalus, mass lesion, acute infarction, or
significant intracranial injury.

Vascular: No hyperdense vessel or unexpected calcification.

Skull: No acute calvarial abnormality.

Sinuses/Orbits: No acute findings

Other: None
IMPRESSION: No acute intracranial abnormality.

## 2022-07-01 ENCOUNTER — Ambulatory Visit
Admission: EM | Admit: 2022-07-01 | Discharge: 2022-07-01 | Disposition: A | Discharge status: Home / Self Care | Payer: Self-pay

## 2022-07-01 ENCOUNTER — Ambulatory Visit (INDEPENDENT_AMBULATORY_CARE_PROVIDER_SITE_OTHER): Payer: Self-pay

## 2022-07-01 DIAGNOSIS — J209 Acute bronchitis, unspecified: Secondary | ICD-10-CM

## 2022-07-01 DIAGNOSIS — R059 Cough, unspecified: Secondary | ICD-10-CM

## 2022-07-01 MED ORDER — PREDNISONE 20 MG PO TABS
40.0000 mg | ORAL_TABLET | Freq: Every day | ORAL | 0 refills | Status: AC
Start: 2022-07-01 — End: 2022-07-06

## 2022-07-01 NOTE — ED Triage Notes (Signed)
Pt presents to uc with co of cough for 2 weeks. Pt reports has worsened over the last two days with cp and now hemoptysis. Pt reports otc cough medications and nyquill and tylenol. No other sick contacts.

## 2022-07-01 NOTE — ED Provider Notes (Signed)
[facility]    CSN: [id number] Arrival date & time: 07/01/22  1035      History   Chief Complaint Chief Complaint  Patient presents with   Cough   Pleurisy    HPI Tracey Alvarado is a 52 y.o. female.   Patient here today for evaluation of cough she has had the last 2 weeks. She reports she has had some improvement of cough with cough meds. She has not had fever. She does report some chest discomfort with cough. She has not had any vomiting or diarrhea. She denies any sick contacts.   The history is provided by the patient.  Cough Associated symptoms: no chills, no ear pain, no eye discharge, no fever, no shortness of breath, no sore throat and no wheezing     History reviewed. No pertinent past medical history.  There are no problems to display for this patient.   Past Surgical History:  Procedure Laterality Date   ABDOMINAL HYSTERECTOMY     CESAREAN SECTION      OB History   No obstetric history on file.      Home Medications    Prior to Admission medications   Medication Sig Start Date End Date Taking? Authorizing Provider  lisinopril-hydrochlorothiazide (ZESTORETIC) 10-12.5 MG tablet Take 1 tablet by mouth every morning. 03/15/22  Yes [provider]  predniSONE (DELTASONE) 20 MG tablet Take 2 tablets (40 mg total) by mouth daily with breakfast for 5 days. 07/01/22 07/06/22 Yes Francene Finders, PA-C  acetaminophen (TYLENOL) 500 MG tablet Take 1,000 mg by mouth every 6 (six) hours as needed for mild pain, fever or headache.     [provider]  EPINEPHrine 0.3 MG/0.3ML SOSY Inject one auto-injector into thigh one time if symptoms of anaphylaxis and seek medical attention. 01/13/20   Riesa Pope, MD  famotidine (PEPCID) 20 MG tablet Take 20 mg by mouth 2 (two) times daily.    [provider]  ibuprofen (ADVIL,MOTRIN) 200 MG tablet Take 400 mg by mouth every 6 (six) hours as needed for fever, headache or mild  pain.     [provider]    Family History History reviewed. No pertinent family history.  Social History Social History   Tobacco Use   Smoking status: Never   Smokeless tobacco: Never  Substance Use Topics   Alcohol use: No   Drug use: No     Allergies   Patient has no known allergies.   Review of Systems Review of Systems  Constitutional:  Negative for chills and fever.  HENT:  Negative for congestion, ear pain and sore throat.   Eyes:  Negative for discharge and redness.  Respiratory:  Positive for cough. Negative for shortness of breath and wheezing.   Gastrointestinal:  Negative for diarrhea, nausea and vomiting.     Physical Exam Triage Vital Signs ED Triage Vitals  Enc Vitals Group     BP 07/01/22 1131 (!) 170/104     Pulse Rate 07/01/22 1131 70     Resp 07/01/22 1131 16     Temp 07/01/22 1131 98 F (36.7 C)     Temp src --      SpO2 07/01/22 1131 98 %     Weight --      Height --      Head Circumference --      Peak Flow --      Pain Score 07/01/22 1129 10     Pain Loc --  Pain Edu? --      Excl. in [place]? --    No data found.  Updated Vital Signs BP (!) 170/104   Pulse 70   Temp 98 F (36.7 C)   Resp 16   SpO2 98%     Physical Exam Vitals and nursing note reviewed.  Constitutional:      General: She is not in acute distress.    Appearance: Normal appearance. She is not ill-appearing.  HENT:     Head: Normocephalic and atraumatic.     Nose: No congestion or rhinorrhea.     Mouth/Throat:     Mouth: Mucous membranes are moist.     Pharynx: No oropharyngeal exudate or posterior oropharyngeal erythema.  Eyes:     Conjunctiva/sclera: Conjunctivae normal.  Cardiovascular:     Rate and Rhythm: Normal rate and regular rhythm.     Heart sounds: Normal heart sounds. No murmur heard. Pulmonary:     Effort: Pulmonary effort is normal. No respiratory distress.     Breath sounds: Normal breath sounds. No wheezing, rhonchi or  rales.  Skin:    General: Skin is warm and dry.  Neurological:     Mental Status: She is alert.  Psychiatric:        Mood and Affect: Mood normal.        Thought Content: Thought content normal.      UC Treatments / Results  Labs (all labs ordered are listed, but only abnormal results are displayed) Labs Reviewed - No data to display  EKG   Radiology DG Chest 2 View  Result Date: 07/01/2022 CLINICAL DATA:  cough EXAM: CHEST - 2 VIEW COMPARISON:  January 13, 2020 FINDINGS: The cardiomediastinal silhouette is normal in contour. No pleural effusion. No pneumothorax. No acute pleuroparenchymal abnormality. Visualized abdomen is unremarkable. No acute osseous abnormality noted. IMPRESSION: No acute cardiopulmonary abnormality. Electronically Signed   By: Valentino Saxon M.D.   On: 07/01/2022 13:32    Procedures Procedures (including critical care time)  Medications Ordered in UC Medications - No data to display  Initial Impression / Assessment and Plan / UC Course  I have reviewed the triage vital signs and the nursing notes.  Pertinent labs & imaging results that were available during my care of the patient were reviewed by me and considered in my medical decision making (see chart for details).    CXR ordered- no evidence of pneumonia or other acute finding. Will treat with steroid burst to cover possible bronchitis given lingering cough and recommended follow up if no gradual improvement or with any further concerns.   Final Clinical Impressions(s) / UC Diagnoses   Final diagnoses:  Acute bronchitis, unspecified organism   Discharge Instructions   None    ED Prescriptions     Medication Sig Dispense Auth. Provider   predniSONE (DELTASONE) 20 MG tablet Take 2 tablets (40 mg total) by mouth daily with breakfast for 5 days. 10 tablet Francene Finders, PA-C      PDMP not reviewed this encounter.   Francene Finders, PA-C 07/01/22 1421

## 2022-09-22 DIAGNOSIS — Z6828 Body mass index (BMI) 28.0-28.9, adult: Secondary | ICD-10-CM | POA: Diagnosis not present

## 2022-09-22 DIAGNOSIS — R600 Localized edema: Secondary | ICD-10-CM | POA: Diagnosis not present

## 2022-09-22 DIAGNOSIS — I10 Essential (primary) hypertension: Secondary | ICD-10-CM | POA: Diagnosis not present

## 2022-09-22 DIAGNOSIS — J301 Allergic rhinitis due to pollen: Secondary | ICD-10-CM | POA: Diagnosis not present

## 2022-09-22 DIAGNOSIS — R053 Chronic cough: Secondary | ICD-10-CM | POA: Diagnosis not present

## 2022-09-22 DIAGNOSIS — Z79899 Other long term (current) drug therapy: Secondary | ICD-10-CM | POA: Diagnosis not present

## 2022-09-22 DIAGNOSIS — Z13220 Encounter for screening for lipoid disorders: Secondary | ICD-10-CM | POA: Diagnosis not present

## 2022-11-09 ENCOUNTER — Other Ambulatory Visit: Payer: Self-pay

## 2022-11-09 ENCOUNTER — Other Ambulatory Visit (HOSPITAL_COMMUNITY): Payer: Self-pay

## 2022-11-09 MED ORDER — LOSARTAN POTASSIUM-HCTZ 50-12.5 MG PO TABS
1.0000 | ORAL_TABLET | Freq: Every day | ORAL | 5 refills | Status: DC
  Filled 2022-11-09: qty 30, 30d supply, fill #0

## 2022-11-10 ENCOUNTER — Other Ambulatory Visit (HOSPITAL_COMMUNITY): Payer: Self-pay

## 2022-11-25 DIAGNOSIS — Z13220 Encounter for screening for lipoid disorders: Secondary | ICD-10-CM | POA: Diagnosis not present

## 2022-11-25 DIAGNOSIS — G5601 Carpal tunnel syndrome, right upper limb: Secondary | ICD-10-CM | POA: Diagnosis not present

## 2022-11-25 DIAGNOSIS — S83421A Sprain of lateral collateral ligament of right knee, initial encounter: Secondary | ICD-10-CM | POA: Diagnosis not present

## 2022-11-25 DIAGNOSIS — Z79899 Other long term (current) drug therapy: Secondary | ICD-10-CM | POA: Diagnosis not present

## 2022-11-25 DIAGNOSIS — Z6828 Body mass index (BMI) 28.0-28.9, adult: Secondary | ICD-10-CM | POA: Diagnosis not present

## 2022-12-20 DIAGNOSIS — G5601 Carpal tunnel syndrome, right upper limb: Secondary | ICD-10-CM | POA: Diagnosis not present

## 2022-12-20 DIAGNOSIS — M25561 Pain in right knee: Secondary | ICD-10-CM | POA: Diagnosis not present

## 2023-01-03 DIAGNOSIS — G5601 Carpal tunnel syndrome, right upper limb: Secondary | ICD-10-CM | POA: Diagnosis not present

## 2023-01-03 DIAGNOSIS — M25561 Pain in right knee: Secondary | ICD-10-CM | POA: Diagnosis not present

## 2023-01-10 DIAGNOSIS — G5601 Carpal tunnel syndrome, right upper limb: Secondary | ICD-10-CM | POA: Diagnosis not present

## 2023-01-10 DIAGNOSIS — M25561 Pain in right knee: Secondary | ICD-10-CM | POA: Diagnosis not present

## 2023-03-01 ENCOUNTER — Other Ambulatory Visit: Payer: Self-pay

## 2023-03-01 ENCOUNTER — Encounter (HOSPITAL_COMMUNITY): Payer: Self-pay | Admitting: Emergency Medicine

## 2023-03-01 ENCOUNTER — Emergency Department (HOSPITAL_COMMUNITY)
Admission: EM | Admit: 2023-03-01 | Discharge: 2023-03-01 | Disposition: A | Discharge status: Home / Self Care | Payer: Commercial Managed Care - PPO | Attending: Emergency Medicine | Admitting: Emergency Medicine

## 2023-03-01 ENCOUNTER — Emergency Department (HOSPITAL_COMMUNITY): Payer: Commercial Managed Care - PPO

## 2023-03-01 DIAGNOSIS — S6992XA Unspecified injury of left wrist, hand and finger(s), initial encounter: Secondary | ICD-10-CM | POA: Diagnosis not present

## 2023-03-01 DIAGNOSIS — S61213A Laceration without foreign body of left middle finger without damage to nail, initial encounter: Secondary | ICD-10-CM | POA: Insufficient documentation

## 2023-03-01 DIAGNOSIS — W268XXA Contact with other sharp object(s), not elsewhere classified, initial encounter: Secondary | ICD-10-CM | POA: Insufficient documentation

## 2023-03-01 MED ORDER — ACETAMINOPHEN 500 MG PO TABS
1000.0000 mg | ORAL_TABLET | Freq: Once | ORAL | Status: AC
  Administered 2023-03-01: 1000 mg via ORAL
  Filled 2023-03-01: qty 2

## 2023-03-01 MED ORDER — LIDOCAINE HCL (PF) 1 % IJ SOLN
5.0000 mL | Freq: Once | INTRAMUSCULAR | Status: AC
  Administered 2023-03-01: 5 mL via INTRADERMAL
  Filled 2023-03-01: qty 5
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
## 2023-03-30 ENCOUNTER — Encounter: Payer: Self-pay | Admitting: *Deleted

## 2023-03-30 ENCOUNTER — Ambulatory Visit
Admission: EM | Admit: 2023-03-30 | Discharge: 2023-03-30 | Disposition: A | Discharge status: Home / Self Care | Payer: Commercial Managed Care - PPO | Attending: Internal Medicine | Admitting: Internal Medicine

## 2023-03-30 ENCOUNTER — Other Ambulatory Visit: Payer: Self-pay

## 2023-03-30 DIAGNOSIS — L02214 Cutaneous abscess of groin: Secondary | ICD-10-CM

## 2023-03-30 HISTORY — DX: Essential (primary) hypertension: I10

## 2023-03-30 MED ORDER — DOXYCYCLINE HYCLATE 100 MG PO CAPS: 100.0000 mg | ORAL_CAPSULE | Freq: Two times a day (BID) | ORAL | 0 refills | Status: DC

## 2023-03-30 NOTE — ED Provider Notes (Signed)
[facility]    CSN: [id number] Arrival date & time: 03/30/23  7829      History   Chief Complaint Chief Complaint  Patient presents with   Abscess    HPI Tracey Alvarado is a 52 y.o. female.   Patient presents with pimple-like lesion present to left groin that has been present for a few days.  Reports that it has gotten larger and more red.  She reports that has had some watery drainage as well.  Denies any fever.  Denies history of the same.   Abscess   Past Medical History:  Diagnosis Date   Hypertension     There are no problems to display for this patient.   Past Surgical History:  Procedure Laterality Date   ABDOMINAL HYSTERECTOMY     CESAREAN SECTION      OB History   No obstetric history on file.      Home Medications    Prior to Admission medications   Medication Sig Start Date End Date Taking? Authorizing Provider  doxycycline (VIBRAMYCIN) 100 MG capsule Take 1 capsule (100 mg total) by mouth 2 (two) times daily. 03/30/23  Yes Kiylee Thoreson, Rolly Salter E, FNP  losartan-hydrochlorothiazide (HYZAAR) 50-12.5 MG tablet Take 1 tablet by mouth daily. 09/28/22  Yes   acetaminophen (TYLENOL) 500 MG tablet Take 1,000 mg by mouth every 6 (six) hours as needed for mild pain, fever or headache.     [provider]  EPINEPHrine 0.3 MG/0.3ML SOSY Inject one auto-injector into thigh one time if symptoms of anaphylaxis and seek medical attention. 01/13/20   Belva Agee, MD  famotidine (PEPCID) 20 MG tablet Take 20 mg by mouth 2 (two) times daily.    [provider]  ibuprofen (ADVIL,MOTRIN) 200 MG tablet Take 400 mg by mouth every 6 (six) hours as needed for fever, headache or mild pain.     [provider]  lisinopril-hydrochlorothiazide (ZESTORETIC) 10-12.5 MG tablet Take 1 tablet by mouth every morning. 03/15/22   [provider]    Family History History reviewed. No pertinent family history.  Social  History Social History   Tobacco Use   Smoking status: Never   Smokeless tobacco: Never  Vaping Use   Vaping status: Never Used  Substance Use Topics   Alcohol use: No   Drug use: No     Allergies   Patient has no known allergies.   Review of Systems Review of Systems Per HPI  Physical Exam Triage Vital Signs ED Triage Vitals  Encounter Vitals Group     BP 03/30/23 0941 (!) 143/91     Systolic BP Percentile --      Diastolic BP Percentile --      Pulse Rate 03/30/23 0941 87     Resp 03/30/23 0941 16     Temp 03/30/23 0941 98.2 F (36.8 C)     Temp Source 03/30/23 0941 Oral     SpO2 03/30/23 0941 98 %     Weight --      Height --      Head Circumference --      Peak Flow --      Pain Score 03/30/23 0938 4     Pain Loc --      Pain Education --      Exclude from Growth Chart --    No data found.  Updated Vital Signs BP (!) 143/91 (BP Location: Left Arm)   Pulse 87   Temp 98.2 F (36.8  C) (Oral)   Resp 16   SpO2 98%   Visual Acuity Right Eye Distance:   Left Eye Distance:   Bilateral Distance:    Right Eye Near:   Left Eye Near:    Bilateral Near:     Physical Exam Exam conducted with a chaperone present.  Constitutional:      General: She is not in acute distress.    Appearance: Normal appearance. She is not toxic-appearing or diaphoretic.  HENT:     Head: Normocephalic and atraumatic.  Eyes:     Extraocular Movements: Extraocular movements intact.     Conjunctiva/sclera: Conjunctivae normal.  Pulmonary:     Effort: Pulmonary effort is normal.  Skin:         Comments: Patient has approximately 1 cm indurated abscess present to left mid groin.  No drainage noted.  Neurological:     General: No focal deficit present.     Mental Status: She is alert and oriented to person, place, and time. Mental status is at baseline.  Psychiatric:        Mood and Affect: Mood normal.        Behavior: Behavior normal.        Thought Content: Thought  content normal.        Judgment: Judgment normal.      UC Treatments / Results  Labs (all labs ordered are listed, but only abnormal results are displayed) Labs Reviewed - No data to display  EKG   Radiology No results found.  Procedures Procedures (including critical care time)  Medications Ordered in UC Medications - No data to display  Initial Impression / Assessment and Plan / UC Course  I have reviewed the triage vital signs and the nursing notes.  Pertinent labs & imaging results that were available during my care of the patient were reviewed by me and considered in my medical decision making (see chart for details).     Patient has small abscess to left groin.  No I&D necessary at this time.  Will treat with doxycycline and patient encouraged to use warm compresses.  Advised strict follow-up if abscess persist or worsens.  Patient verbalized understanding and was agreeable with plan.  Patient wished for her family member to help interpret today. Final Clinical Impressions(s) / UC Diagnoses   Final diagnoses:  Abscess, groin     Discharge Instructions      I have prescribed an antibiotic to take for abscess of your groin.  Use warm compresses as we discussed.  Follow-up if symptoms persist or worsen.    ED Prescriptions     Medication Sig Dispense Auth. Provider   doxycycline (VIBRAMYCIN) 100 MG capsule Take 1 capsule (100 mg total) by mouth 2 (two) times daily. 20 capsule Gustavus Bryant, [place]      PDMP not reviewed this encounter.   Gustavus Bryant, [place] 03/30/23 [phone]

## 2023-03-30 NOTE — Discharge Instructions (Signed)
I have prescribed an antibiotic to take for abscess of your groin.  Use warm compresses as we discussed.  Follow-up if symptoms persist or worsen.

## 2023-03-30 NOTE — ED Triage Notes (Signed)
Pt reports "pimple" to left groin this week Has been draining since yesterday

## 2023-11-29 ENCOUNTER — Encounter (HOSPITAL_COMMUNITY): Payer: Self-pay | Admitting: Emergency Medicine

## 2023-11-29 ENCOUNTER — Emergency Department (HOSPITAL_COMMUNITY)

## 2023-11-29 ENCOUNTER — Emergency Department (HOSPITAL_COMMUNITY)
Admission: EM | Admit: 2023-11-29 | Discharge: 2023-11-29 | Disposition: A | Discharge status: Home / Self Care | Attending: Emergency Medicine | Admitting: Emergency Medicine

## 2023-11-29 ENCOUNTER — Encounter (HOSPITAL_COMMUNITY): Payer: Self-pay

## 2023-11-29 ENCOUNTER — Other Ambulatory Visit: Payer: Self-pay

## 2023-11-29 ENCOUNTER — Ambulatory Visit (HOSPITAL_COMMUNITY): Admission: EM | Admit: 2023-11-29 | Discharge: 2023-11-29 | Disposition: A | Discharge status: Home / Self Care

## 2023-11-29 DIAGNOSIS — J341 Cyst and mucocele of nose and nasal sinus: Secondary | ICD-10-CM | POA: Diagnosis not present

## 2023-11-29 DIAGNOSIS — R531 Weakness: Secondary | ICD-10-CM | POA: Diagnosis not present

## 2023-11-29 DIAGNOSIS — R200 Anesthesia of skin: Secondary | ICD-10-CM | POA: Insufficient documentation

## 2023-11-29 DIAGNOSIS — H538 Other visual disturbances: Secondary | ICD-10-CM | POA: Insufficient documentation

## 2023-11-29 DIAGNOSIS — R519 Headache, unspecified: Secondary | ICD-10-CM

## 2023-11-29 DIAGNOSIS — I10 Essential (primary) hypertension: Secondary | ICD-10-CM | POA: Diagnosis not present

## 2023-11-29 DIAGNOSIS — R42 Dizziness and giddiness: Secondary | ICD-10-CM | POA: Diagnosis not present

## 2023-11-29 DIAGNOSIS — R030 Elevated blood-pressure reading, without diagnosis of hypertension: Secondary | ICD-10-CM | POA: Diagnosis not present

## 2023-11-29 LAB — CBC WITH DIFFERENTIAL/PLATELET
Abs Immature Granulocytes: 0.01 10*3/uL (ref 0.00–0.07)
Basophils Absolute: 0 10*3/uL (ref 0.0–0.1)
Basophils Relative: 1 %
Eosinophils Absolute: 0.1 10*3/uL (ref 0.0–0.5)
Eosinophils Relative: 2 %
HCT: 41.4 % (ref 36.0–46.0)
Hemoglobin: 13.6 g/dL (ref 12.0–15.0)
Immature Granulocytes: 0 %
Lymphocytes Relative: 38 %
Lymphs Abs: 2 10*3/uL (ref 0.7–4.0)
MCH: 30.3 pg (ref 26.0–34.0)
MCHC: 32.9 g/dL (ref 30.0–36.0)
MCV: 92.2 fL (ref 80.0–100.0)
Monocytes Absolute: 0.5 10*3/uL (ref 0.1–1.0)
Monocytes Relative: 9 %
Neutro Abs: 2.6 10*3/uL (ref 1.7–7.7)
Neutrophils Relative %: 50 %
Platelets: 163 10*3/uL (ref 150–400)
RBC: 4.49 MIL/uL (ref 3.87–5.11)
RDW: 12.6 % (ref 11.5–15.5)
WBC: 5.2 10*3/uL (ref 4.0–10.5)
nRBC: 0 % (ref 0.0–0.2)

## 2023-11-29 LAB — COMPREHENSIVE METABOLIC PANEL WITH GFR
ALT: 34 U/L (ref 0–44)
AST: 26 U/L (ref 15–41)
Albumin: 4.1 g/dL (ref 3.5–5.0)
Alkaline Phosphatase: 63 U/L (ref 38–126)
Anion gap: 8 (ref 5–15)
BUN: 19 mg/dL (ref 6–20)
CO2: 28 mmol/L (ref 22–32)
Calcium: 9.5 mg/dL (ref 8.9–10.3)
Chloride: 104 mmol/L (ref 98–111)
Creatinine, Ser: 0.65 mg/dL (ref 0.44–1.00)
GFR, Estimated: >60 mL/min (ref >60)
Glucose, Bld: 109 mg/dL — ABNORMAL HIGH (ref 70–99)
Potassium: 3.7 mmol/L (ref 3.5–5.1)
Sodium: 140 mmol/L (ref 135–145)
Total Bilirubin: 0.9 mg/dL (ref 0.0–1.2)
Total Protein: 7.2 g/dL (ref 6.5–8.1)

## 2023-11-29 LAB — CBG MONITORING, ED: Glucose-Capillary: 91 mg/dL (ref 70–99)

## 2023-11-29 LAB — MAGNESIUM: Magnesium: 2.2 mg/dL (ref 1.7–2.4)

## 2023-11-29 MED ORDER — ACETAMINOPHEN 500 MG PO TABS
1000.0000 mg | ORAL_TABLET | Freq: Once | ORAL | Status: AC
  Administered 2023-11-29: 1000 mg via ORAL
  Filled 2023-11-29: qty 2

## 2023-11-29 NOTE — ED Notes (Signed)
 Pt returned from MRI

## 2023-11-29 NOTE — ED Triage Notes (Signed)
 Pt c/o headache, blurred vision and dizziness that started this am. Pt has bilateral hand numbness. Pt's BP has been elevated, has not been taking HTN meds. Pt went to UC, sent to ED for eval.

## 2023-11-29 NOTE — ED Provider Notes (Signed)
 [facility]    CSN: [id number] Arrival date & time: 11/29/23  0815      History   Chief Complaint No chief complaint on file.   HPI Tracey Alvarado is a 53 y.o. female.   53 year old female pt, Tracey Alvarado, presents to urgent care for evaluation of right sided headache "5/10",  blurred vision,weakness, hand numbness, dizziness that started approximately 0800 today after pt got off work. Pt states she has hx of HTN but stopped BP meds 1 year prior as "was doing good".  Pt denies head injury or  LOC. GCS is 15, alert and oriented to person, place time,situation, MAEW x 4, handgrips equal bilaterally.    The history is provided by the patient. No language interpreter was used.    Past Medical History:  Diagnosis Date   Hypertension     There are no active problems to display for this patient.   Past Surgical History:  Procedure Laterality Date   ABDOMINAL HYSTERECTOMY     CESAREAN SECTION      OB History   No obstetric history on file.      Home Medications    Prior to Admission medications   Medication Sig Start Date End Date Taking? Authorizing Provider  acetaminophen  (TYLENOL ) 500 MG tablet Take 1,000 mg by mouth every 6 (six) hours as needed for mild pain, fever or headache.     [provider]  EPINEPHrine  0.3 MG/0.3ML SOSY Inject one auto-injector into thigh one time if symptoms of anaphylaxis and seek medical attention. 01/13/20   Katsadouros, Vasilios, MD    Family History History reviewed. No pertinent family history.  Social History Social History   Tobacco Use   Smoking status: Never   Smokeless tobacco: Never  Vaping Use   Vaping status: Never Used  Substance Use Topics   Alcohol use: No   Drug use: No     Allergies   Patient has no known allergies.   Review of Systems Review of Systems  Constitutional:  Negative for fever.  Eyes:  Positive for visual disturbance.  Cardiovascular:  Negative  for chest pain and palpitations.  Gastrointestinal:  Negative for nausea and vomiting.  Neurological:  Positive for dizziness, weakness, numbness and headaches.  All other systems reviewed and are negative.    Physical Exam Triage Vital Signs ED Triage Vitals  Encounter Vitals Group     BP 11/29/23 0839 (!) 184/104     Systolic BP Percentile --      Diastolic BP Percentile --      Pulse Rate 11/29/23 0839 68     Resp 11/29/23 0839 20     Temp 11/29/23 0839 97.9 F (36.6 C)     Temp Source 11/29/23 0839 Oral     SpO2 11/29/23 0839 97 %     Weight --      Height --      Head Circumference --      Peak Flow --      Pain Score 11/29/23 0847 5     Pain Loc --      Pain Education --      Exclude from Growth Chart --    No data found.  Updated Vital Signs BP (!) 184/104 (BP Location: Right Arm)   Pulse 68   Temp 97.9 F (36.6 C) (Oral)   Resp 20   SpO2 97%   Visual Acuity Right Eye Distance:   Left Eye Distance:   Bilateral Distance:  Right Eye Near:   Left Eye Near:    Bilateral Near:     Physical Exam Vitals and nursing note reviewed.  Constitutional:      Appearance: She is well-developed and well-groomed.  HENT:     Head: Normocephalic.  Cardiovascular:     Rate and Rhythm: Normal rate.  Pulmonary:     Effort: Pulmonary effort is normal.  Neurological:     General: No focal deficit present.     Mental Status: She is alert and oriented to person, place, and time.     GCS: GCS eye subscore is 4. GCS verbal subscore is 5. GCS motor subscore is 6.     Motor: Motor function is intact.  Psychiatric:        Attention and Perception: Attention normal.        Mood and Affect: Mood normal.        Speech: Speech normal.        Behavior: Behavior normal. Behavior is cooperative.      UC Treatments / Results  Labs (all labs ordered are listed, but only abnormal results are displayed) Labs Reviewed - No data to  display  EKG   Radiology   Procedures Procedures (including critical care time)  Medications Ordered in UC Medications - No data to display  Initial Impression / Assessment and Plan / UC Course  I have reviewed the triage vital signs and the nursing notes.  Pertinent labs & imaging results that were available during my care of the patient were reviewed by me and considered in my medical decision making (see chart for details).  Clinical Course as of 11/29/23 1135  Tue Nov 29, 2023  1610 Pt wishes to be ruled out for CVA, discussed with pt we are unable to work up for CVA in urgent care setting, referred to ER, pt wishes to go POV with son.  [JD]    Clinical Course User Index [JD] Kennadi Albany, Eveleen Hinds, NP    Ddx: Headache,CVA/TIA,non-compliance with medication/HTN management,  trigeminal neuralgia Final Clinical Impressions(s) / UC Diagnoses   Final diagnoses:  None   Discharge Instructions   None    ED Prescriptions   None    PDMP not reviewed this encounter.   Peter Brands, NP 11/29/23 1138

## 2023-11-29 NOTE — ED Notes (Signed)
 Patient is being discharged from the Urgent Care and sent to the Emergency Department via POV . Per Eveleen Hinds, NP, patient is in need of higher level of care due to weakness, headache, blurred vision/limited resources.  . Patient is aware and verbalizes understanding of plan of care.  Vitals:   11/29/23 0839  BP: (!) 184/104  Pulse: 68  Resp: 20  Temp: 97.9 F (36.6 C)  SpO2: 97%

## 2023-11-29 NOTE — ED Provider Notes (Signed)
 [place] EMERGENCY DEPARTMENT AT [facility] Provider Note   CSN: [id number] Arrival date & time: 11/29/23  4098     History  Chief Complaint  Patient presents with   Headache    Tracey Alvarado is a 53 y.o. female.  53 year old female with a past medical history of hypertension currently noncompliant with medication presents to the ED with a chief complaint of headache, blurry vision, dizziness which began approximately an hour and a half prior to arrival in the ED.  She reports she was at home when suddenly she began to have some right-sided head pressure that was constant radiating to the left side of her head.  She endorses the pain about a 5.  She reports she began to feel tingling to bilateral upper extremities and numbness.  She went to urgent care referred her to the emergency department due to her elevated blood pressure.  She reports the numbness and tingling to bilateral upper extremities has now resolved.  She reports taking blood pressure medication, however discontinued this as she did not know she had to refill it.  She also reports an episode of vision changes, which consistent on her only being able to see blurry when the headache started.  She has not taken any medication for improvement in symptoms.  No prior episodes similar to these.  No fever, no neck pain, no prior history of aneurysms, no trauma or changes or speech.   The history is provided by the patient.  Headache Pain location:  R parietal Quality:  Dull Radiates to:  Does not radiate Associated symptoms: numbness and weakness   Associated symptoms: no abdominal pain, no back pain, no fever, no nausea, no sore throat and no vomiting        Home Medications Prior to Admission medications   Medication Sig Start Date End Date Taking? Authorizing Provider  acetaminophen  (TYLENOL ) 500 MG tablet Take 1,000 mg by mouth every 6 (six) hours as needed for mild pain, fever or headache.    Yes  [provider]  EPINEPHrine  0.3 MG/0.3ML SOSY Inject one auto-injector into thigh one time if symptoms of anaphylaxis and seek medical attention. 01/13/20  Yes Katsadouros, Vasilios, MD      Allergies    Patient has no known allergies.    Review of Systems   Review of Systems  Constitutional:  Negative for chills and fever.  HENT:  Negative for sore throat.   Eyes:  Positive for visual disturbance.  Respiratory:  Negative for shortness of breath.   Cardiovascular:  Negative for chest pain.  Gastrointestinal:  Negative for abdominal pain, nausea and vomiting.  Genitourinary:  Negative for flank pain.  Musculoskeletal:  Negative for back pain.  Neurological:  Positive for weakness, numbness and headaches.  All other systems reviewed and are negative.   Physical Exam Updated Vital Signs BP (!) 153/73   Pulse 63   Temp 98.1 F (36.7 C) (Oral)   Resp 19   Ht 5\' 4"  (1.626 m)   Wt 66.7 kg   SpO2 99%   BMI 25.23 kg/m  Physical Exam Vitals and nursing note reviewed.  Constitutional:      Appearance: She is well-developed.  HENT:     Head: Normocephalic and atraumatic.  Eyes:     Extraocular Movements: Extraocular movements intact.  Cardiovascular:     Rate and Rhythm: Normal rate.  Pulmonary:     Effort: Pulmonary effort is normal.     Breath sounds: No wheezing.  Abdominal:     Palpations: Abdomen is soft.  Musculoskeletal:     Cervical back: Normal range of motion and neck supple.  Skin:    General: Skin is warm.  Neurological:     Mental Status: She is alert and oriented to person, place, and time.     GCS: GCS eye subscore is 4. GCS verbal subscore is 5. GCS motor subscore is 6.     Comments: Alert, oriented, thought content appropriate. Speech fluent without evidence of aphasia. Able to follow 2 step commands without difficulty.  Cranial Nerves:  II:  Peripheral visual fields grossly normal, pupils, round, reactive to light III,IV, VI: ptosis not  present, extra-ocular motions intact bilaterally  V,VII: smile symmetric, facial light touch sensation equal VIII: hearing grossly normal bilaterally  IX,X: midline uvula rise  XI: bilateral shoulder shrug equal and strong XII: midline tongue extension  Motor:  5/5 in upper and lower extremities bilaterally including strong and equal grip strength and dorsiflexion/plantar flexion Sensory: light touch normal in all extremities.  Cerebellar: normal finger-to-nose with bilateral upper extremities, pronator drift negative Gait: n/a    Psychiatric:        Behavior: Behavior normal.     ED Results / Procedures / Treatments   Labs (all labs ordered are listed, but only abnormal results are displayed) Labs Reviewed  COMPREHENSIVE METABOLIC PANEL WITH GFR - Abnormal; Notable for the following components:      Result Value   Glucose, Bld 109 (*)    All other components within normal limits  CBC WITH DIFFERENTIAL/PLATELET  MAGNESIUM  URINALYSIS, ROUTINE W REFLEX MICROSCOPIC  CBG MONITORING, ED    EKG EKG Interpretation Date/Time:  Tuesday November 29 2023 09:13:12 EDT Ventricular Rate:  74 PR Interval:  150 QRS Duration:  80 QT Interval:  399 QTC Calculation: 443 R Axis:   71  Text Interpretation: Sinus rhythm Borderline T abnormalities, anterior leads Confirmed by Hiawatha Lout ([id number]) on 11/29/2023 9:23:36 AM  Radiology MR BRAIN WO CONTRAST Result Date: 11/29/2023 CLINICAL DATA:  Provided history: Headache, no red flags. Additional history provided: Dizziness, blurred vision, bilateral hand numbness, elevated blood pressure. EXAM: MRI HEAD WITHOUT CONTRAST TECHNIQUE: Multiplanar, multiecho pulse sequences of the brain and surrounding structures were obtained without intravenous contrast. COMPARISON:  Head CT 11/29/2023. FINDINGS: Brain: Cerebral volume is normal. There are few small artifactual foci of FLAIR hyperintense signal within the bilateral frontal lobe white matter  (correlating with the axial and coronal T2 sequences). There is no acute infarct. No evidence of an intracranial mass. No chronic intracranial blood products. No extra-axial fluid collection. No midline shift. Vascular: Maintained flow voids within the proximal large arterial vessels. Skull and upper cervical spine: No focal worrisome marrow lesion. Sinuses/Orbits: No mass or acute finding within the imaged orbits. Small mucous retention cyst within the right maxillary sinus. IMPRESSION: 1. Unremarkable non-contrast MRI appearance of the brain. No evidence of an acute intracranial abnormality. 2. Small right maxillary sinus mucous retention cyst. Electronically Signed   By: Bascom Lily D.O.   On: 11/29/2023 11:34   CT HEAD WO CONTRAST ( ) Result Date: 11/29/2023 CLINICAL DATA:  53 year old female with headache, blurred vision, dizziness onset this morning. EXAM: CT HEAD WITHOUT CONTRAST TECHNIQUE: Contiguous axial images were obtained from the base of the skull through the vertex without intravenous contrast. RADIATION DOSE REDUCTION: This exam was performed according to the departmental dose-optimization program which includes automated exposure control, adjustment of the mA and/or kV according to patient  size and/or use of iterative reconstruction technique. COMPARISON:  Head CT 06/26/2021. FINDINGS: Brain: Cerebral volume remains normal for age. No midline shift, ventriculomegaly, mass effect, evidence of mass lesion, intracranial hemorrhage or evidence of cortically based acute infarction. Gray-white matter differentiation is within normal limits throughout the brain. Vascular: No suspicious intracranial vascular hyperdensity. Faint basal ganglia vascular calcifications most apparent on the sagittal images. Skull: Stable and intact. Sinuses/Orbits: Visualized paranasal sinuses and mastoids are clear. Other: Visualized orbits and scalp soft tissues are within normal limits. IMPRESSION: Stable and normal for  age noncontrast Head CT. Electronically Signed   By: Marlise Simpers M.D.   On: 11/29/2023 09:41    Procedures Procedures    Medications Ordered in ED Medications  acetaminophen  (TYLENOL ) tablet 1,000 mg (1,000 mg Oral Given 11/29/23 1207)    ED Course/ Medical Decision Making/ A&P                                 Medical Decision Making Amount and/or Complexity of Data Reviewed Labs: ordered. Radiology: ordered.  Risk OTC drugs.    This patient presents to the ED for concern of headache, this involves a number of treatment options, and is a complaint that carries with it a high risk of complications and morbidity.  The differential diagnosis includes TIA, CVA versus bad headache.    Co morbidities: Discussed in HPI   Brief History:  See HPI  EMR reviewed including pt PMHx, past surgical history and past visits to ER.   See HPI for more details   Lab Tests:  I ordered and independently interpreted labs.  The pertinent results include:    I personally reviewed all laboratory work and imaging. Metabolic panel without any acute abnormality specifically kidney function within normal limits and no significant electrolyte abnormalities. CBC without leukocytosis or significant anemia.   Imaging Studies:  NAD. I personally reviewed all imaging studies and no acute abnormality found. I agree with radiology interpretation.  Cardiac Monitoring:  The patient was maintained on a cardiac monitor.  I personally viewed and interpreted the cardiac monitored which showed an underlying rhythm of: NSR EKG non-ischemic   Medicines ordered:  I ordered medication including tylenol   for head pain Reevaluation of the patient after these medicines showed that the patient improved I have reviewed the patients home medicines and have made adjustments as needed  Reevaluation:  After the interventions noted above I re-evaluated patient and found that they have :improved  Social  Determinants of Health:  The patient's social determinants of health were a factor in the care of this patient  Problem List / ED Course:  Patient here with bad headache dull and aching to the right side of her head and radiating to the left, also tingling and numbness to bilateral hands and some vision changes.  No prior history of this, did note elevated blood pressure at home, has discontinued taking her medication as she did not know she had to get this medication refilled.  Here neurological exam is intact, she is ANO x 4.  Blood work is unremarkable CBC, CMP without any electrolyte derangement to do her symptoms, current levels unremarkable peer LFTs are within normal limits.  Magnesium levels normal.  EKG is normal sinus rhythm, she is not having any chest pain, shortness of breath but did voice some dizziness. Head CT was ordered in order to rule out any intracranial abnormality such as hemorrhage  versus infarct.  CT head is normal at this time.  Blood pressure has trended down and last read of 152/90, no medication has been given to her for BP control. Based on presentation along with neurological symptoms and vision changes, I do feel that patient warrants MRI brain to further evaluate for any occult infarct versus mass.  Patient remains hemodynamically stable. MRI brain without any acute findings in this time.  Blood pressure has lowered to 153/73, headache is improved after Tylenol  to a 2 out of 10.  I did discuss with her appropriate follow-up with PCP, will start a blood pressure monitor in order to obtain adequate regimen.  She is agreeable to plan treatment, return precautions discussed at length.  Patient is hemodynamically stable for discharge.   Dispostion:  After consideration of the diagnostic results and the patients response to treatment, I feel that the patent would benefit from resuming blood pressure occasion, following up with primary care physician.   Portions of this  note were generated with Scientist, clinical (histocompatibility and immunogenetics). Dictation errors may occur despite best attempts at proofreading.   Final Clinical Impression(s) / ED Diagnoses Final diagnoses:  Bad headache    Rx / DC Orders ED Discharge Orders     None         Luellen Sages, PA-C 11/29/23 1251

## 2023-11-29 NOTE — Discharge Instructions (Addendum)
 Your laboratories also within normal limits today.  You may log your blood pressure for the upcoming week in order to follow-up with your primary care physician and obtain further medication.  If you experience any worsening symptoms please return to the emergency department.

## 2023-11-29 NOTE — ED Triage Notes (Signed)
 Patient works night shift.  Noted headache on right side of head, then moved to left side of head .  Patient reports blurry vision, unable to see well.  Vision is distorted.    Bilateral leg weakness and numbness, hands bilaterally weak and numb.    Blood pressure at home was 175/106.   Son is present and translating.    Son notes patient is sluggish.   Onset 8 am of symptoms.

## 2023-11-29 NOTE — ED Notes (Signed)
 Not taking blood pressure medicine.  Stopped a year ago, was feeling fine

## 2023-11-29 NOTE — ED Notes (Signed)
 Patient transported to MRI

## 2023-11-29 NOTE — ED Notes (Signed)
 Pt to CT scan.

## 2023-11-29 NOTE — ED Notes (Addendum)
 Pt NAD, HA improved 2/10. Family at bedside.

## 2023-12-25 ENCOUNTER — Other Ambulatory Visit: Payer: Self-pay

## 2023-12-25 ENCOUNTER — Emergency Department (HOSPITAL_COMMUNITY)
Admission: EM | Admit: 2023-12-25 | Discharge: 2023-12-26 | Disposition: A | Discharge status: Home / Self Care | Attending: Emergency Medicine | Admitting: Emergency Medicine

## 2023-12-25 ENCOUNTER — Encounter (HOSPITAL_COMMUNITY): Payer: Self-pay | Admitting: *Deleted

## 2023-12-25 DIAGNOSIS — R519 Headache, unspecified: Secondary | ICD-10-CM | POA: Insufficient documentation

## 2023-12-25 DIAGNOSIS — M545 Low back pain, unspecified: Secondary | ICD-10-CM | POA: Diagnosis not present

## 2023-12-25 DIAGNOSIS — I10 Essential (primary) hypertension: Secondary | ICD-10-CM | POA: Insufficient documentation

## 2023-12-25 DIAGNOSIS — S29012A Strain of muscle and tendon of back wall of thorax, initial encounter: Secondary | ICD-10-CM | POA: Diagnosis not present

## 2023-12-25 DIAGNOSIS — S0990XA Unspecified injury of head, initial encounter: Secondary | ICD-10-CM | POA: Diagnosis not present

## 2023-12-25 DIAGNOSIS — M549 Dorsalgia, unspecified: Secondary | ICD-10-CM | POA: Diagnosis not present

## 2023-12-25 DIAGNOSIS — S161XXA Strain of muscle, fascia and tendon at neck level, initial encounter: Secondary | ICD-10-CM | POA: Diagnosis not present

## 2023-12-25 DIAGNOSIS — Y92410 Unspecified street and highway as the place of occurrence of the external cause: Secondary | ICD-10-CM | POA: Diagnosis not present

## 2023-12-25 DIAGNOSIS — M542 Cervicalgia: Secondary | ICD-10-CM | POA: Diagnosis present

## 2023-12-25 DIAGNOSIS — S199XXA Unspecified injury of neck, initial encounter: Secondary | ICD-10-CM | POA: Diagnosis not present

## 2023-12-25 DIAGNOSIS — S39012A Strain of muscle, fascia and tendon of lower back, initial encounter: Secondary | ICD-10-CM | POA: Diagnosis not present

## 2023-12-25 DIAGNOSIS — T148XXA Other injury of unspecified body region, initial encounter: Secondary | ICD-10-CM

## 2023-12-25 MED ORDER — ACETAMINOPHEN 325 MG PO TABS
650.0000 mg | ORAL_TABLET | Freq: Once | ORAL | Status: AC
  Administered 2023-12-25: 650 mg via ORAL
  Filled 2023-12-25: qty 2
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
## 2023-12-25 NOTE — ED Notes (Signed)
C-collar placed for comfort

## 2023-12-26 ENCOUNTER — Emergency Department (HOSPITAL_COMMUNITY)

## 2023-12-26 ENCOUNTER — Encounter (HOSPITAL_COMMUNITY): Payer: Self-pay

## 2023-12-26 DIAGNOSIS — S0990XA Unspecified injury of head, initial encounter: Secondary | ICD-10-CM | POA: Diagnosis not present

## 2023-12-26 DIAGNOSIS — S199XXA Unspecified injury of neck, initial encounter: Secondary | ICD-10-CM | POA: Diagnosis not present

## 2023-12-26 DIAGNOSIS — M549 Dorsalgia, unspecified: Secondary | ICD-10-CM | POA: Diagnosis not present

## 2023-12-26 DIAGNOSIS — M545 Low back pain, unspecified: Secondary | ICD-10-CM | POA: Diagnosis not present

## 2023-12-26 MED ORDER — KETOROLAC TROMETHAMINE 60 MG/2ML IM SOLN
30.0000 mg | Freq: Once | INTRAMUSCULAR | Status: AC
  Administered 2023-12-26: 30 mg via INTRAMUSCULAR
  Filled 2023-12-26: qty 2

## 2023-12-26 NOTE — Discharge Instructions (Signed)
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
## 2024-07-17 ENCOUNTER — Encounter (HOSPITAL_COMMUNITY): Payer: Self-pay

## 2024-07-17 ENCOUNTER — Ambulatory Visit (HOSPITAL_COMMUNITY)
Admission: EM | Admit: 2024-07-17 | Discharge: 2024-07-17 | Disposition: A | Discharge status: Home / Self Care | Attending: Family Medicine | Admitting: Family Medicine

## 2024-07-17 DIAGNOSIS — R519 Headache, unspecified: Secondary | ICD-10-CM

## 2024-07-17 DIAGNOSIS — I10 Essential (primary) hypertension: Secondary | ICD-10-CM | POA: Diagnosis not present

## 2024-07-17 MED ORDER — LOSARTAN POTASSIUM-HCTZ 50-12.5 MG PO TABS
1.0000 | ORAL_TABLET | Freq: Every day | ORAL | 2 refills | Status: AC
  Filled 2024-07-17: qty 30, 30d supply, fill #0

## 2024-07-17 NOTE — ED Triage Notes (Signed)
 Pt presents with complaints of high blood pressure. She has been taking her blood pressure at home. Reports headache x 1 week. Reports having to take medication a couple years ago but has not taken it since.

## 2024-07-17 NOTE — ED Provider Notes (Signed)
 " [facility]    CSN: [id number] Arrival date & time: 07/17/24  1433      History   Chief Complaint Chief Complaint  Patient presents with   Hypertension    HPI Tracey Alvarado is a 54 y.o. female.    Hypertension   Here for headache and elevated blood pressure.  She states that about a year or 2 ago she was on medication for blood pressure.  She stopped taking it when she did not have any more refills and did not follow-up with her primary care.  She has occasionally checked her blood pressure and often it will be good at 130s and sometimes 140s.  In the last week she has had some headache and she has been checking her blood pressure and it has been much higher.   no syncope and no chest pain and no edema.    NKDA She has had a hysterectomy  Of note she was seen in the emergency room for hypertension and headache in June 2025 and at that point her CMP and CBC were normal, with a mildly elevated sugar of 109.  Past Medical History:  Diagnosis Date   Hypertension     Patient Active Problem List   Diagnosis Date Noted   Bad headache 11/29/2023   Hypertension 11/29/2023    Past Surgical History:  Procedure Laterality Date   ABDOMINAL HYSTERECTOMY     CESAREAN SECTION      OB History   No obstetric history on file.      Home Medications    Prior to Admission medications  Medication Sig Start Date End Date Taking? Authorizing Provider  losartan -hydrochlorothiazide  (HYZAAR ) 50-12.5 MG tablet Take 1 tablet by mouth daily. 07/17/24  Yes Vonna Sharlet POUR, MD  acetaminophen  (TYLENOL ) 500 MG tablet Take 1,000 mg by mouth every 6 (six) hours as needed for mild pain, fever or headache.     [provider]  EPINEPHrine  0.3 MG/0.3ML SOSY Inject one auto-injector into thigh one time if symptoms of anaphylaxis and seek medical attention. 01/13/20   Katsadouros, Vasilios, MD    Family History History reviewed. No pertinent family  history.  Social History Social History[1]   Allergies   Patient has no known allergies.   Review of Systems Review of Systems   Physical Exam Triage Vital Signs ED Triage Vitals  Encounter Vitals Group     BP 07/17/24 1632 (!) 170/105     Girls Systolic BP Percentile --      Girls Diastolic BP Percentile --      Boys Systolic BP Percentile --      Boys Diastolic BP Percentile --      Pulse Rate 07/17/24 1632 83     Resp 07/17/24 1632 19     Temp 07/17/24 1632 98 F (36.7 C)     Temp src --      SpO2 07/17/24 1632 97 %     Weight --      Height --      Head Circumference --      Peak Flow --      Pain Score 07/17/24 1631 4     Pain Loc --      Pain Education --      Exclude from Growth Chart --    No data found.  Updated Vital Signs BP (!) 170/105   Pulse 83   Temp 98 F (36.7 C)   Resp 19   SpO2 97%  Visual Acuity Right Eye Distance:   Left Eye Distance:   Bilateral Distance:    Right Eye Near:   Left Eye Near:    Bilateral Near:     Physical Exam Vitals reviewed.  Constitutional:      General: She is not in acute distress.    Appearance: She is not ill-appearing, toxic-appearing or diaphoretic.  HENT:     Left Ear: Tympanic membrane normal.     Mouth/Throat:     Mouth: Mucous membranes are moist.     Pharynx: No oropharyngeal exudate or posterior oropharyngeal erythema.  Eyes:     Extraocular Movements: Extraocular movements intact.     Conjunctiva/sclera: Conjunctivae normal.     Pupils: Pupils are equal, round, and reactive to light.  Cardiovascular:     Rate and Rhythm: Normal rate and regular rhythm.     Heart sounds: No murmur heard. Pulmonary:     Effort: Pulmonary effort is normal. No respiratory distress.     Breath sounds: No stridor. No wheezing, rhonchi or rales.  Musculoskeletal:     Cervical back: Neck supple.     Right lower leg: No edema.     Left lower leg: No edema.  Lymphadenopathy:     Cervical: No cervical  adenopathy.  Skin:    Coloration: Skin is not jaundiced or pale.  Neurological:     General: No focal deficit present.     Mental Status: She is alert and oriented to person, place, and time.  Psychiatric:        Behavior: Behavior normal.      UC Treatments / Results  Labs (all labs ordered are listed, but only abnormal results are displayed) Labs Reviewed - No data to display  EKG   Radiology No results found.  Procedures Procedures (including critical care time)  Medications Ordered in UC Medications - No data to display  Initial Impression / Assessment and Plan / UC Course  I have reviewed the triage vital signs and the nursing notes.  Pertinent labs & imaging results that were available during my care of the patient were reviewed by me and considered in my medical decision making (see chart for details).     I am restarting the losartan  HCT that I can find in her old med list.  Prescription is sent to the community pharmacy.  Staff will help her make a primary care appointment.  (It is set up for February 4) she asked if we could dispense her a tablet of her medication here in the clinic and we do not have losartan  HCT to dispense here. Final Clinical Impressions(s) / UC Diagnoses   Final diagnoses:  Essential hypertension  Nonintractable headache, unspecified chronicity pattern, unspecified headache type     Discharge Instructions      Losartan  HCT 50-12.5 mg--take 1 daily for blood pressure  Most likely this dosing will need to be adjusted and your new primary care may need to add medications to adequately control your blood pressure  Goal for your blood pressure is to be less than 140/less than 90.  If you worsen in any way, with worse headache or weakness or edema, please go to the emergency room    ED Prescriptions     Medication Sig Dispense Auth. Provider   losartan -hydrochlorothiazide  (HYZAAR ) 50-12.5 MG tablet Take 1 tablet by mouth daily. 30  tablet Kaleea Penner K, MD      PDMP not reviewed this encounter.    [1]  Social  History Tobacco Use   Smoking status: Never   Smokeless tobacco: Never  Vaping Use   Vaping status: Never Used  Substance Use Topics   Alcohol use: No   Drug use: No     Vonna Sharlet POUR, MD 07/17/24 1825  "

## 2024-07-17 NOTE — Discharge Instructions (Signed)
 Losartan  HCT 50-12.5 mg--take 1 daily for blood pressure  Most likely this dosing will need to be adjusted and your new primary care may need to add medications to adequately control your blood pressure  Goal for your blood pressure is to be less than 140/less than 90.  If you worsen in any way, with worse headache or weakness or edema, please go to the emergency room

## 2024-07-18 ENCOUNTER — Other Ambulatory Visit: Payer: Self-pay

## 2024-07-25 ENCOUNTER — Ambulatory Visit: Admitting: Sports Medicine

## 2024-07-25 ENCOUNTER — Encounter: Payer: Self-pay | Admitting: Sports Medicine

## 2024-07-25 VITALS — BP 140/87 | HR 69 | Temp 98.2°F | Ht 60.0 in | Wt 148.1 lb

## 2024-07-25 DIAGNOSIS — Z113 Encounter for screening for infections with a predominantly sexual mode of transmission: Secondary | ICD-10-CM

## 2024-07-25 DIAGNOSIS — Z131 Encounter for screening for diabetes mellitus: Secondary | ICD-10-CM

## 2024-07-25 DIAGNOSIS — Z13220 Encounter for screening for lipoid disorders: Secondary | ICD-10-CM

## 2024-07-25 DIAGNOSIS — R079 Chest pain, unspecified: Secondary | ICD-10-CM

## 2024-07-25 DIAGNOSIS — L509 Urticaria, unspecified: Secondary | ICD-10-CM

## 2024-07-25 DIAGNOSIS — R42 Dizziness and giddiness: Secondary | ICD-10-CM

## 2024-07-25 DIAGNOSIS — Z1329 Encounter for screening for other suspected endocrine disorder: Secondary | ICD-10-CM

## 2024-07-25 DIAGNOSIS — I10 Essential (primary) hypertension: Secondary | ICD-10-CM

## 2024-07-25 DIAGNOSIS — Z1211 Encounter for screening for malignant neoplasm of colon: Secondary | ICD-10-CM

## 2024-07-25 NOTE — Progress Notes (Signed)
 "  New Patient Office Visit  Patient ID: Tracey Alvarado, Female   DOB: [birth year] 54 y.o. MRN: [id number] Subjective:     Discussed the use of AI scribe software for clinical note transcription with the patient, who gave verbal consent to proceed.  History of Present Illness  Tracey Alvarado is a 54 year old female who presents to establish care c/o  high blood pressure and dizziness.  She recently visited the emergency room due to high blood pressure and was prescribed medication, which she has not started. Her home blood pressure readings have been consistently high, ranging from 155/87 to 180/87.  She experiences dizziness and headaches, which began on July 17, 2024. The dizziness occurs intermittently, often when standing up or walking, and is sometimes accompanied by palpitations but not chest pain. She drinks a lot of water daily.  She describes a past episode of chest pain last month, lasting 10-15 minutes, not related to food intake, and resolving on its own. The pain did not radiate to her arm. She describes as a sensation of food pushing up but has no current chest pain.  She has a history of a hysterectomy performed many years ago due to heavy bleeding. She feels pressure in her lower abdomen during exercise but no pain at rest. No urinary symptoms or blood in her stool.  She has been feeling depressed for about three months, related to her job, but has no severe symptoms of depression such as lack of motivation or suicidal thoughts. She reports good sleep and energy levels.  She experiences intermittent hives, with the last episode occurring last night. These hives have been a recurring issue since childhood.   Outpatient Encounter Medications as of 07/25/2024  Medication Sig   acetaminophen  (TYLENOL ) 500 MG tablet Take 1,000 mg by mouth every 6 (six) hours as needed for mild pain, fever or headache.    losartan -hydrochlorothiazide  (HYZAAR ) 50-12.5 MG tablet  Take 1 tablet by mouth daily.   EPINEPHrine  0.3 MG/0.3ML SOSY Inject one auto-injector into thigh one time if symptoms of anaphylaxis and seek medical attention. (Patient not taking: Reported on 07/25/2024)   No facility-administered encounter medications on file as of 07/25/2024.    Past Medical History:  Diagnosis Date   Hypertension     Past Surgical History:  Procedure Laterality Date   ABDOMINAL HYSTERECTOMY     CESAREAN SECTION      History reviewed. No pertinent family history.  Social History   Socioeconomic History   Marital status: Married    Spouse name: Not on file   Number of children: Not on file   Years of education: Not on file   Highest education level: Not on file  Occupational History   Not on file  Tobacco Use   Smoking status: Never   Smokeless tobacco: Never  Vaping Use   Vaping status: Never Used  Substance and Sexual Activity   Alcohol use: No   Drug use: No   Sexual activity: Not on file  Other Topics Concern   Not on file  Social History Narrative   Not on file   Social Drivers of Health   Tobacco Use: Low Risk (07/25/2024)   Patient History    Smoking Tobacco Use: Never    Smokeless Tobacco Use: Never    Passive Exposure: Not on file  Financial Resource Strain: Not on file  Food Insecurity: Not on file  Transportation Needs: Not on file  Physical Activity: Not on file  Stress: Not  on file  Social Connections: Not on file  Intimate Partner Violence: Not on file  Depression [phone]): Low Risk (07/25/2024)   Depression (PHQ2-9)    PHQ-2 Score: 1  Alcohol Screen: Not on file  Housing: Not on file  Utilities: Not on file  Health Literacy: Not on file    Review of Systems  Constitutional:  Negative for chills and fever.  HENT:  Negative for congestion and sore throat.   Eyes:  Negative for double vision.  Respiratory:  Negative for cough, sputum production and shortness of breath.   Cardiovascular:  Negative for chest pain,  palpitations and leg swelling.  Gastrointestinal:  Negative for abdominal pain, heartburn and nausea.  Genitourinary:  Negative for dysuria, frequency and hematuria.  Musculoskeletal:  Negative for falls and myalgias.  Neurological:  Positive for dizziness. Negative for sensory change, focal weakness and headaches.     Objective:    BP (!) 140/87   Pulse 69   Temp 98.2 F (36.8 C) (Oral)   Ht 5' (1.524 m)   Wt 148 lb 1.9 oz (67.2 kg)   SpO2 98%   BMI 28.93 kg/m   Physical Exam Constitutional:      Appearance: Normal appearance.  HENT:     Head: Normocephalic and atraumatic.  Cardiovascular:     Rate and Rhythm: Normal rate and regular rhythm.  Pulmonary:     Effort: Pulmonary effort is normal. No respiratory distress.     Breath sounds: Normal breath sounds. No wheezing.  Abdominal:     General: Bowel sounds are normal. There is no distension.     Tenderness: There is no abdominal tenderness. There is no guarding or rebound.     Comments:    Musculoskeletal:        General: No swelling or tenderness.  Skin:    General: Skin is dry.  Neurological:     Mental Status: She is alert. Mental status is at baseline.     Sensory: No sensory deficit.     Motor: No weakness.     Comments: Eomi Finger nose neg No nystagmus Strength and sensations intact     Last CBC Lab Results  Component Value Date   WBC 5.2 11/29/2023   HGB 13.6 11/29/2023   HCT 41.4 11/29/2023   MCV 92.2 11/29/2023   MCH 30.3 11/29/2023   RDW 12.6 11/29/2023   PLT 163 11/29/2023   Last metabolic panel Lab Results  Component Value Date   GLUCOSE 109 (H) 11/29/2023   NA 140 11/29/2023   K 3.7 11/29/2023   CL 104 11/29/2023   CO2 28 11/29/2023   BUN 19 11/29/2023   CREATININE 0.65 11/29/2023   GFRNONAA >60 11/29/2023   CALCIUM 9.5 11/29/2023   PROT 7.2 11/29/2023   ALBUMIN 4.1 11/29/2023   BILITOT 0.9 11/29/2023   ALKPHOS 63 11/29/2023   AST 26 11/29/2023   ALT 34 11/29/2023   ANIONGAP  8 11/29/2023   Last lipids No results found for: CHOL, HDL, LDLCALC, LDLDIRECT, TRIG, CHOLHDL Last hemoglobin A1c No results found for: HGBA1C Last thyroid functions No results found for: TSH, T3TOTAL, T4TOTAL, FREET4, THYROIDAB Last vitamin D No results found for: 25OHVITD2, 25OHVITD3, VD25OH Last vitamin B12 and Folate No results found for: VITAMINB12, FOLATE     Assessment & Plan:   Assessment & Plan Primary hypertension Bp high  Pt hasn't picked the medication from pharmacy  Instructed patient to pick the medicine and start taking Monitor bp daily and keep  a log Avoid salty foods Exercise regularly     Dizziness Ortho static  Orders:   CBC with Differential/Platelet; Future   COMPLETE METABOLIC PANEL WITHOUT GFR; Future   TSH; Future  Hives Intermittent Denies swelling of lips of tongue Will refer to allergy clinic Orders:   Ambulatory referral to Allergy  Screening for lipid disorders  Orders:   Lipid panel; Future  Screening for diabetes mellitus  Orders:   Hemoglobin A1c; Future  Screening for thyroid disorder  Orders:   TSH; Future  Screening for colon cancer  Orders:   Ambulatory referral to Gastroenterology  Screening examination for STD (sexually transmitted disease)  Orders:   HIV antibody (with reflex); Future   Hepatitis C Antibody; Future  Chest pain, unspecified type Last episode about a month ago Will refer to cardiology for stress test Orders:   Ambulatory referral to Cardiology   Return in about 4 weeks (around 08/22/2024).   Tracey Blazing, MD [facility]    "

## 2024-07-25 NOTE — Assessment & Plan Note (Signed)
 Bp high  Pt hasn't picked the medication from pharmacy  Instructed patient to pick the medicine and start taking Monitor bp daily and keep a log Avoid salty foods Exercise regularly

## 2024-08-02 ENCOUNTER — Other Ambulatory Visit

## 2024-08-23 ENCOUNTER — Ambulatory Visit: Admitting: Sports Medicine
